# Patient Record
Sex: Female | Born: 1995
Health system: Southern US, Community
[De-identification: ages and names within clinical notes are randomized; demographics above are authoritative.]

## PROBLEM LIST (undated history)

## (undated) DIAGNOSIS — T7422XA Child sexual abuse, confirmed, initial encounter: Secondary | ICD-10-CM

## (undated) DIAGNOSIS — Z21 Asymptomatic human immunodeficiency virus [HIV] infection status: Secondary | ICD-10-CM

## (undated) DIAGNOSIS — F99 Mental disorder, not otherwise specified: Secondary | ICD-10-CM

## (undated) DIAGNOSIS — F259 Schizoaffective disorder, unspecified: Secondary | ICD-10-CM

## (undated) DIAGNOSIS — O98912 Unspecified maternal infectious and parasitic disease complicating pregnancy, second trimester: Secondary | ICD-10-CM

## (undated) DIAGNOSIS — B20 Human immunodeficiency virus [HIV] disease: Secondary | ICD-10-CM

## (undated) HISTORY — DX: Schizoaffective disorder, unspecified: F25.9

## (undated) HISTORY — DX: Unspecified maternal infectious and parasitic disease complicating pregnancy, second trimester: O98.912

## (undated) HISTORY — DX: Child sexual abuse, confirmed, initial encounter: T74.22XA

## (undated) HISTORY — DX: Asymptomatic human immunodeficiency virus (hiv) infection status: Z21

## (undated) HISTORY — DX: Mental disorder, not otherwise specified: F99

## (undated) HISTORY — PX: WISDOM TOOTH EXTRACTION: SHX21

## (undated) HISTORY — DX: Human immunodeficiency virus (HIV) disease: B20

---
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
## 2016-03-24 ENCOUNTER — Ambulatory Visit (INDEPENDENT_AMBULATORY_CARE_PROVIDER_SITE_OTHER): Payer: Medicaid Other | Admitting: Clinical

## 2016-03-24 ENCOUNTER — Ambulatory Visit (INDEPENDENT_AMBULATORY_CARE_PROVIDER_SITE_OTHER): Payer: Medicaid Other

## 2016-03-24 DIAGNOSIS — F3160 Bipolar disorder, current episode mixed, unspecified: Secondary | ICD-10-CM | POA: Diagnosis not present

## 2016-03-24 DIAGNOSIS — Z3202 Encounter for pregnancy test, result negative: Secondary | ICD-10-CM

## 2016-03-24 LAB — POCT PREGNANCY, URINE: PREG TEST UR: NEGATIVE

## 2016-03-24 NOTE — BH Specialist Note (Signed)
Session Start time: 3:50   End Time: 4:20 Total Time:  30 minutes Type of Service: Behavioral Health - Individual/Family Interpreter: No.   Interpreter Name & Language: n/a # [facility]   SUBJECTIVE: [name] is a 20 y.o. female  Pt. was referred by Dr Shawnie PonsPratt for:  anxiety and depression. Pt. reports the following symptoms/concerns: Pt states that she [facility], did not like BH meds(Abilify), [facility], and needs to [facility] back [facility] with negative pregnancy test prior to changing BH meds. Pt says no SI today, though thoughts have been present for years; has coped by "separating myself from the thoughts", journaling, and spending time in quiet.  Duration of problem:  Over two years.  Severity: severe Previous treatment: [facility]   OBJECTIVE: Mood: Appropriate & Affect: Appropriate Risk of harm to self or others: Low risk of harm today as thoughts come and go for years, but no SI today; no known risk of harm to others Assessments administered: PHQ9: 11/ GAD7: 20  LIFE CONTEXT:  Family & Social: No family in [place], family in [place]; supportive friends in [place] School/ Work: Unknown  Self-Care: Sleep issues, sometimes unable to go to sleep; other times, days and nights "reversed" Life changes: None recent What is important to pt/family (values): Overall wellbeing   GOALS ADDRESSED:  -Alleviate symptoms of anxiety and depression, related to bipolar disorder -Increase self-coping skills   INTERVENTIONS: Strength-based and Meditation: CALM relaxation breathing exercise   ASSESSMENT:  Pt currently experiencing Bipolar affective disorder, current episode mixed, current episode severity unspecified. Pt may benefit from psychoeducation and brief therapeutic interventions regarding coping with symptoms of anxiety and depression related to bipolar affective disorder.    PLAN: 1. F/U with behavioral health  clinician: As needed 2. Behavioral Health meds: none  3. Behavioral recommendations:  -[facility] to [facility] psychiat[facility] -Begin therapy sessions at [facility], as recommended by psychiatry -Continue using journaling and other known coping methods to cope daily -Consider relaxation breathing exercise, as practiced in office visit today, as additional self-coping strategy -Read educational material regarding coping with symptoms of anxiety and depression 4. Referral: Brief Counseling/Psychotherapy and Psychoeducation 5. From scale of 1-10, how likely are you to follow plan: Undetermined  Gaynell FaceJamie C Lennox Dolberry LCSWA Behavioral Health Clinician  Marlon PelWarmhandoff:   Warm Hand Off Completed.        Depression screen PHQ 2/9 03/24/2016  Decreased Interest 0  Down, Depressed, Hopeless 3  PHQ - 2 Score 3  Altered sleeping 3  Tired, decreased energy 2  Change in appetite 1  Feeling bad or failure about yourself  0  Trouble concentrating 0  Moving slowly or fidgety/restless 0  Suicidal thoughts 2  PHQ-9 Score 11   GAD 7 : Generalized Anxiety Score 03/24/2016  Nervous, Anxious, on Edge 3  Control/stop worrying 3  Worry too much - different things 3  Trouble relaxing 3  Restless 3  Easily annoyed or irritable 3  Afraid - awful might happen 2  Total GAD 7 Score 20

## 2016-03-24 NOTE — Patient Instructions (Signed)
Patient presented to the office today for a pregnancy test. Test confirms she is not pregnant at this time. Patient is unsure of her last menstrual cycle. I have advised patient to take a home pregnancy test in two weeks to see if results have change. If not she should schedule an appointment to see one of the provider here. Patient verbalizes understanding at this time.

## 2016-04-08 ENCOUNTER — Telehealth: Payer: Self-pay | Admitting: General Practice

## 2016-04-10 ENCOUNTER — Ambulatory Visit: Payer: Self-pay

## 2016-04-28 ENCOUNTER — Ambulatory Visit (HOSPITAL_COMMUNITY)
Admission: EM | Admit: 2016-04-28 | Discharge: 2016-04-28 | Disposition: A | Discharge status: Home / Self Care | Payer: Medicaid Other | Attending: Family Medicine | Admitting: Family Medicine

## 2016-04-28 ENCOUNTER — Encounter (HOSPITAL_COMMUNITY): Payer: Self-pay | Admitting: Emergency Medicine

## 2016-04-28 DIAGNOSIS — R109 Unspecified abdominal pain: Secondary | ICD-10-CM | POA: Diagnosis not present

## 2016-04-28 LAB — POCT URINALYSIS DIP (DEVICE)
Bilirubin Urine: NEGATIVE
Glucose, UA: NEGATIVE mg/dL
KETONES UR: NEGATIVE mg/dL
LEUKOCYTES UA: NEGATIVE
NITRITE: NEGATIVE
PH: 7 (ref 5.0–8.0)
Protein, ur: 30 mg/dL — AB
SPECIFIC GRAVITY, URINE: 1.025 (ref 1.005–1.030)
UROBILINOGEN UA: 2 mg/dL — AB (ref 0.0–1.0)

## 2016-04-28 LAB — POCT PREGNANCY, URINE: Preg Test, Ur: NEGATIVE

## 2016-04-28 MED ORDER — DICLOFENAC POTASSIUM 50 MG PO TABS: 50.0000 mg | ORAL_TABLET | Freq: Three times a day (TID) | ORAL | 0 refills | Status: DC

## 2016-04-28 MED ORDER — CYCLOBENZAPRINE HCL 5 MG PO TABS: 5.0000 mg | ORAL_TABLET | Freq: Three times a day (TID) | ORAL | 0 refills | Status: DC

## 2016-04-28 NOTE — ED Triage Notes (Signed)
Pain in right lower back, flank, and into right thigh.  Pain for "2 months"  Denies urinary symptoms

## 2016-04-28 NOTE — ED Provider Notes (Signed)
[facility]    CSN: [id number] Arrival date & time: 04/28/16  1339     History   Chief Complaint Chief Complaint  Patient presents with  . Flank Pain    HPI Anna LandauKatrina Mason is a 21 y.o. female.   The history is provided by the patient.  Flank Pain  This is a chronic problem. Episode onset: 2mos of sx. The problem has been gradually worsening. Associated symptoms include abdominal pain. Pertinent negatives include no chest pain, no headaches and no shortness of breath. The symptoms are aggravated by twisting.    History reviewed. No pertinent past medical history.  Patient Active Problem List   Diagnosis Date Noted  . MDD (major depressive disorder), recurrent episode, severe (HCC) 10/25/2013  . Suicidal ideation 10/25/2013  . Suicide attempt 10/25/2013  . Anxiety state, unspecified 10/25/2013  . Anticholinergic drug overdose 10/23/2013    Past Surgical History:  Procedure Laterality Date  . WISDOM TOOTH EXTRACTION      OB History    No data available       Home Medications    Prior to Admission medications   Medication Sig Start Date End Date Taking? Authorizing Provider  citalopram (CELEXA) 20 MG tablet Take 1 tablet (20 mg total) by mouth daily. Patient not taking: Reported on 03/24/2016 11/01/13   Kendrick FriesMeghan Blankmann, NP  cyclobenzaprine (FLEXERIL) 5 MG tablet Take 1 tablet (5 mg total) by mouth 3 (three) times daily. 04/28/16   Linna HoffJames D Pam Vanalstine, MD  diclofenac (CATAFLAM) 50 MG tablet Take 1 tablet (50 mg total) by mouth 3 (three) times daily. 04/28/16   Linna HoffJames D Rashida Ladouceur, MD  traZODone (DESYREL) 50 MG tablet Take 1 tablet (50 mg total) by mouth at bedtime. Patient not taking: Reported on 03/24/2016 11/01/13   Kendrick FriesMeghan Blankmann, NP    Family History No family history on file.  Social History Social History  Substance Use Topics  . Smoking status: Never Smoker  . Smokeless tobacco: Never Used  . Alcohol use No     Allergies   Patient has no known  allergies.   Review of Systems Review of Systems  Constitutional: Negative.   Respiratory: Negative for shortness of breath.   Cardiovascular: Negative for chest pain.  Gastrointestinal: Positive for abdominal pain. Negative for diarrhea, nausea and vomiting.  Genitourinary: Positive for flank pain. Negative for decreased urine volume, hematuria, pelvic pain and urgency.  Neurological: Negative for headaches.  All other systems reviewed and are negative.    Physical Exam Triage Vital Signs ED Triage Vitals  Enc Vitals Group     BP 04/28/16 1437 142/89     Pulse Rate 04/28/16 1437 73     Resp 04/28/16 1437 16     Temp 04/28/16 1437 99.2 F (37.3 C)     Temp Source 04/28/16 1437 Oral     SpO2 04/28/16 1437 100 %     Weight --      Height --      Head Circumference --      Peak Flow --      Pain Score 04/28/16 1436 10     Pain Loc --      Pain Edu? --      Excl. in GC? --    No data found.   Updated Vital Signs BP 142/89 (BP Location: Left Arm)   Pulse 73   Temp 99.2 F (37.3 C) (Oral)   Resp 16   LMP 04/28/2016   SpO2 100%  Visual Acuity Right Eye Distance:   Left Eye Distance:   Bilateral Distance:    Right Eye Near:   Left Eye Near:    Bilateral Near:     Physical Exam  Constitutional: She is oriented to person, place, and time. She appears well-developed and well-nourished. No distress.  Cardiovascular: Normal rate, regular rhythm, normal heart sounds and intact distal pulses.   Pulmonary/Chest: Effort normal and breath sounds normal.  Abdominal: Soft. Bowel sounds are normal. She exhibits no distension and no mass. There is tenderness. There is no rebound and no guarding. No hernia.  Musculoskeletal: She exhibits tenderness.  Neurological: She is alert and oriented to person, place, and time. No cranial nerve deficit.  Skin: Skin is warm and dry.  Nursing note and vitals reviewed.    UC Treatments / Results  Labs (all labs ordered are listed,  but only abnormal results are displayed) Labs Reviewed  POCT URINALYSIS DIP (DEVICE) - Abnormal; Notable for the following:       Result Value   Hgb urine dipstick TRACE (*)    Protein, ur 30 (*)    Urobilinogen, UA 2.0 (*)    All other components within normal limits  POCT PREGNANCY, URINE    EKG  EKG Interpretation None       Radiology No results found.  Procedures Procedures (including critical care time)  Medications Ordered in UC Medications - No data to display   Initial Impression / Assessment and Plan / UC Course  I have reviewed the triage vital signs and the nursing notes.  Pertinent labs & imaging results that were available during my care of the patient were reviewed by me and considered in my medical decision making (see chart for details).       Final Clinical Impressions(s) / UC Diagnoses   Final diagnoses:  Right flank pain    New Prescriptions Discharge Medication List as of 04/28/2016  4:12 PM    START taking these medications   Details  cyclobenzaprine (FLEXERIL) 5 MG tablet Take 1 tablet (5 mg total) by mouth 3 (three) times daily., Starting Mon 04/28/2016, Normal    diclofenac (CATAFLAM) 50 MG tablet Take 1 tablet (50 mg total) by mouth 3 (three) times daily., Starting Mon 04/28/2016, Normal         Linna Hoff, MD 05/15/16 [phone]

## 2016-04-28 NOTE — Discharge Instructions (Signed)
Heat to back and medicine as needed, return to ER if further problems.

## 2016-04-28 NOTE — ED Provider Notes (Signed)
CSN: [id number]     Arrival date & time 04/28/16  1339 History   First MD Initiated Contact with Patient 04/28/16 1553     Chief Complaint  Patient presents with  . Flank Pain   (Consider location/radiation/quality/duration/timing/severity/associated sxs/prior Treatment) C/o back pain   The history is provided by the patient.  Flank Pain  This is a new problem. The current episode started more than 1 week ago. The problem occurs constantly. The problem has not changed since onset.   History reviewed. No pertinent past medical history. Past Surgical History:  Procedure Laterality Date  . WISDOM TOOTH EXTRACTION     No family history on file. Social History  Substance Use Topics  . Smoking status: Never Smoker  . Smokeless tobacco: Never Used  . Alcohol use No   OB History    No data available     Review of Systems  Constitutional: Negative.   HENT: Negative.   Eyes: Negative.   Respiratory: Negative.   Cardiovascular: Negative.   Gastrointestinal: Negative.   Endocrine: Negative.   Genitourinary: Positive for flank pain.  Allergic/Immunologic: Negative.   Neurological: Negative.   Hematological: Negative.     Allergies  Patient has no known allergies.  Home Medications   Prior to Admission medications   Medication Sig Start Date End Date Taking? Authorizing Provider  citalopram (CELEXA) 20 MG tablet Take 1 tablet (20 mg total) by mouth daily. Patient not taking: Reported on 03/24/2016 11/01/13   Kendrick Fries, NP  cyclobenzaprine (FLEXERIL) 5 MG tablet Take 1 tablet (5 mg total) by mouth 3 (three) times daily. 04/28/16   Linna Hoff, MD  diclofenac (CATAFLAM) 50 MG tablet Take 1 tablet (50 mg total) by mouth 3 (three) times daily. 04/28/16   Linna Hoff, MD  traZODone (DESYREL) 50 MG tablet Take 1 tablet (50 mg total) by mouth at bedtime. Patient not taking: Reported on 03/24/2016 11/01/13   Kendrick Fries, NP   Meds Ordered and Administered this Visit   Medications - No data to display  BP 142/89 (BP Location: Left Arm)   Pulse 73   Temp 99.2 F (37.3 C) (Oral)   Resp 16   LMP 04/28/2016   SpO2 100%  No data found.   Physical Exam  Constitutional: She appears well-developed and well-nourished.  HENT:  Head: Normocephalic and atraumatic.  Right Ear: External ear normal.  Left Ear: External ear normal.  Mouth/Throat: Oropharynx is clear and moist.  Eyes: Conjunctivae and EOM are normal. Pupils are equal, round, and reactive to light.  Neck: Normal range of motion. Neck supple.  Cardiovascular: Normal rate, regular rhythm and normal heart sounds.   Pulmonary/Chest: Breath sounds normal.  Musculoskeletal: She exhibits tenderness.  TTP LS spine.  Nursing note and vitals reviewed.   Urgent Care Course   Clinical Course     Procedures (including critical care time)  Labs Review Labs Reviewed  POCT URINALYSIS DIP (DEVICE) - Abnormal; Notable for the following:       Result Value   Hgb urine dipstick TRACE (*)    Protein, ur 30 (*)    Urobilinogen, UA 2.0 (*)    All other components within normal limits  POCT PREGNANCY, URINE    Imaging Review No results found.   Visual Acuity Review  Right Eye Distance:   Left Eye Distance:   Bilateral Distance:    Right Eye Near:   Left Eye Near:    Bilateral Near:  MDM   1. Right flank pain    Diclofenac Cyclobenzaprine        Deatra CanterWilliam J Berdell Nevitt, FNP 04/28/16 2121

## 2017-03-18 ENCOUNTER — Encounter (HOSPITAL_COMMUNITY): Payer: Self-pay | Admitting: Emergency Medicine

## 2017-03-18 ENCOUNTER — Emergency Department (HOSPITAL_COMMUNITY)
Admission: EM | Admit: 2017-03-18 | Discharge: 2017-03-18 | Disposition: A | Discharge status: Home / Self Care | Payer: Medicaid Other | Attending: Emergency Medicine | Admitting: Emergency Medicine

## 2017-03-18 ENCOUNTER — Emergency Department (HOSPITAL_COMMUNITY): Payer: Medicaid Other

## 2017-03-18 DIAGNOSIS — Z202 Contact with and (suspected) exposure to infections with a predominantly sexual mode of transmission: Secondary | ICD-10-CM | POA: Diagnosis not present

## 2017-03-18 DIAGNOSIS — R05 Cough: Secondary | ICD-10-CM | POA: Diagnosis present

## 2017-03-18 DIAGNOSIS — J189 Pneumonia, unspecified organism: Secondary | ICD-10-CM

## 2017-03-18 DIAGNOSIS — J181 Lobar pneumonia, unspecified organism: Secondary | ICD-10-CM | POA: Diagnosis not present

## 2017-03-18 DIAGNOSIS — H1033 Unspecified acute conjunctivitis, bilateral: Secondary | ICD-10-CM | POA: Diagnosis not present

## 2017-03-18 DIAGNOSIS — Z79899 Other long term (current) drug therapy: Secondary | ICD-10-CM | POA: Diagnosis not present

## 2017-03-18 MED ORDER — ERYTHROMYCIN 5 MG/GM OP OINT
TOPICAL_OINTMENT | Freq: Once | OPHTHALMIC | Status: AC
  Administered 2017-03-18: 1 via OPHTHALMIC
  Filled 2017-03-18: qty 3.5

## 2017-03-18 MED ORDER — CEFTRIAXONE SODIUM 1 G IJ SOLR
1.0000 g | Freq: Once | INTRAMUSCULAR | Status: AC
  Administered 2017-03-18: 1 g via INTRAMUSCULAR
  Filled 2017-03-18: qty 10

## 2017-03-18 MED ORDER — PROMETHAZINE-DM 6.25-15 MG/5ML PO SYRP: 5.0000 mL | ORAL_SOLUTION | Freq: Four times a day (QID) | ORAL | 0 refills | Status: DC | PRN

## 2017-03-18 MED ORDER — DOXYCYCLINE HYCLATE 100 MG PO CAPS: 100.0000 mg | ORAL_CAPSULE | Freq: Two times a day (BID) | ORAL | 0 refills | Status: DC

## 2017-03-18 MED ORDER — METRONIDAZOLE 500 MG PO TABS
2000.0000 mg | ORAL_TABLET | Freq: Once | ORAL | Status: AC
  Administered 2017-03-18: 2000 mg via ORAL
  Filled 2017-03-18: qty 4

## 2017-03-18 NOTE — Discharge Instructions (Signed)
Your chest x-ray shows that you have pneumonia in the left lower lung.  The treatment for this is an antibiotic called doxycycline.  Take this antibiotic 2 times daily for the next 7 days.  Do not stop taking this medication before you finish off 14 tablets even if you start to feel better.  You can also take 5 mL of promethazine next methylmorphine cough syrup once every 6 hours as needed for cough and congestion.  650 mg of Tylenol or 600 mg of ibuprofen with food can be taken every 6-8 hours as needed for pain control.  You have also been treated for trichomonas, gonorrhea, and chlamydia, which are sexually transmitted infections, in the emergency department.  It is important that you do not have sex or engage in any kind of sexual activity for 7 days after being treated because you increase the risk of getting reinfected.  It is important that all sexual partners now that you are currently being treated.  For the drainage from her eyes, please place a 0.5 inch (approximately the width of the tip of your pinky) of erythromycin ointment in both eyes every 6 hours for the next 5 days.  It is important to wash your hands anytime you touch her eyes or touch her face because it is possible to pass this infection to other people.  If your symptoms do not improve in the next 3-5 days, please follow-up with your primary care provider.  If you develop new or worsening symptoms, including difficulty breathing, fever that does not improve with Tylenol, changes to vision, or other concerning symptoms, please return to the emergency department for reevaluation.

## 2017-03-18 NOTE — ED Provider Notes (Signed)
[facility]-EMERGENCY DEPT Provider Note   CSN: [id number] Arrival date & time: 03/18/17  1035     History   Chief Complaint Chief Complaint  Patient presents with  . Cough    HPI Anna Mason is a 21 y.o. female who presents to the emergency department with a chief complaint of nasal congestion, rhinorrhea, nonproductive cough times 2 weeks.  She reports her symptoms have been constant, but have not worsened.  No aggravating or alleviating factors.  She also complains of bilateral eye redness and purulent discharge from her bilateral eyes since yesterday and reports that her eyes were matted shut when she woke this morning. she denies fever, chills, sinus pain or pressure, dyspnea, nausea, vomiting, diarrhea, myalgias, or otalgia.  She is treated her symptoms with Mucinex and DayQuil since onset with no improvement.  She reports that her female sexual partner has recently been complaining of discharge from his penis, and she is concerned she may have an STD. She denies vaginal itching, discharge, pain, or bleeding. No dyspareunia.  She denies frequency, dysuria, or urinary hesitancy.  No back or pelvic pain.  She reports that she is currently only sexually active with one female partner. LMP 03/11/17.   She does not wear contact lens.  The history is provided by the patient. No language interpreter was used.    History reviewed. No pertinent past medical history.  Patient Active Problem List   Diagnosis Date Noted  . MDD (major depressive disorder), recurrent episode, severe (HCC) 10/25/2013  . Suicidal ideation 10/25/2013  . Suicide attempt (HCC) 10/25/2013  . Anxiety state, unspecified 10/25/2013  . Anticholinergic drug overdose 10/23/2013    Past Surgical History:  Procedure Laterality Date  . WISDOM TOOTH EXTRACTION      OB History    No data available       Home Medications    Prior to Admission medications   Medication Sig Start Date End  Date Taking? Authorizing Provider  GuaiFENesin (COUGH SYRUP PO) Take 15 mLs by mouth daily as needed (cough).    Yes [provider]  guaiFENesin (MUCINEX) 600 MG 12 hr tablet Take 600 mg by mouth 2 (two) times daily as needed for cough (FLU).   Yes [provider]  doxycycline (VIBRAMYCIN) 100 MG capsule Take 1 capsule (100 mg total) by mouth 2 (two) times daily. 03/18/17   Nadyne Gariepy A, PA-C  promethazine-dextromethorphan (PROMETHAZINE-DM) 6.25-15 MG/5ML syrup Take 5 mLs by mouth 4 (four) times daily as needed for cough. 03/18/17   Clevester Helzer A, PA-C    Family History No family history on file.  Social History Social History   Tobacco Use  . Smoking status: Never Smoker  . Smokeless tobacco: Never Used  Substance Use Topics  . Alcohol use: No  . Drug use: Yes    Types: Marijuana     Allergies   Pineapple   Review of Systems Review of Systems  Constitutional: Negative for chills and fever.  HENT: Positive for congestion, postnasal drip, rhinorrhea and sore throat. Negative for ear discharge, ear pain, facial swelling, mouth sores and trouble swallowing.   Eyes: Positive for discharge and redness.  Respiratory: Positive for cough. Negative for shortness of breath.   Cardiovascular: Negative for chest pain.  Gastrointestinal: Negative for diarrhea, nausea and vomiting.  Genitourinary: Negative for decreased urine volume, dysuria, menstrual problem, vaginal bleeding, vaginal discharge and vaginal pain.  Musculoskeletal: Negative for back pain.  Skin: Negative for rash.  Allergic/Immunologic:  Negative for immunocompromised state.  Neurological: Positive for headaches.  Psychiatric/Behavioral: Negative for confusion.     Physical Exam Updated Vital Signs BP 132/82 (BP Location: Left Arm)   Pulse (!) 113   Temp 98.4 F (36.9 C) (Oral)   Resp 18   Ht 5\' 7"  (1.702 m)   Wt 77.1 kg (170 lb)   LMP 03/11/2017   SpO2 99%   BMI 26.63 kg/m   Physical  Exam  Constitutional: No distress.  HENT:  Head: Normocephalic.  Right Ear: Hearing and ear canal normal. A middle ear effusion is present.  Left Ear: Hearing and ear canal normal. A middle ear effusion is present.  Nose: Mucosal edema and rhinorrhea present. Right sinus exhibits no maxillary sinus tenderness. Left sinus exhibits no maxillary sinus tenderness and no frontal sinus tenderness.  Mouth/Throat: Uvula is midline, oropharynx is clear and moist and mucous membranes are normal. No tonsillar exudate.  Eyes: EOM are normal. Pupils are equal, round, and reactive to light. Right eye exhibits discharge. Left eye exhibits discharge. Right conjunctiva is injected. Left conjunctiva is injected.  Purulent discharge noted from the bilateral eyes.  Neck: Neck supple.  Cardiovascular: Normal rate, regular rhythm, normal heart sounds and intact distal pulses. Exam reveals no gallop and no friction rub.  No murmur heard. Pulmonary/Chest: Effort normal. No respiratory distress.  Crackles heard in the left lower lobe.  Right lung is clear to auscultation.  Abdominal: Soft. She exhibits no distension.  Neurological: She is alert.  Skin: Skin is warm. No rash noted.  Psychiatric: Her behavior is normal.  Nursing note and vitals reviewed.    ED Treatments / Results  Labs (all labs ordered are listed, but only abnormal results are displayed) Labs Reviewed - No data to display  EKG  EKG Interpretation None       Radiology Dg Chest 2 View  Result Date: 03/18/2017 CLINICAL DATA:  Cough and congestion.  Runny nose. EXAM: CHEST  2 VIEW COMPARISON:  None. FINDINGS: Airspace disease at the medial left base. No effusion or visible cavitation. Normal heart size and mediastinal contours. IMPRESSION: Left lower lobe pneumonia. Electronically Signed   By: Marnee SpringJonathon  Watts M.D.   On: 03/18/2017 14:12    Procedures Procedures (including critical care time)  Medications Ordered in ED Medications    cefTRIAXone (ROCEPHIN) injection 1 g (not administered)  metroNIDAZOLE (FLAGYL) tablet 2,000 mg (not administered)  erythromycin ophthalmic ointment (not administered)     Initial Impression / Assessment and Plan / ED Course  I have reviewed the triage vital signs and the nursing notes.  Pertinent labs & imaging results that were available during my care of the patient were reviewed by me and considered in my medical decision making (see chart for details).     21 year old female presenting with multiple complaints including cough, nasal congestion, rhinorrhea, and postnasal drip x2 weeks.  She also reports bilateral eye redness with purulent drainage and eyes matted shut bilaterally this AM.  She also reports that her female sexual partner was recently treated for STD.  Discussed and evaluated this patient with Dr. Eudelia Bunchardama, attending physician.  Will treat the patient for trichomonas, gonorrhea, and chlamydia.  Chest x-ray demonstrating left lower lobe pneumonia.  We will discharge the patient with a 7-day course of doxycycline to treat CAP and chlamydia.  Will treat the patient with IM ceftriaxone for gonorrhea.  The patient also has symptoms consistent with bilateral bacterial conjunctivitis.  She does not wear contact lens  so we will discharge the patient with erythromycin ointment and follow-up to ophthalmology if her symptoms do not improve.  She is hemodynamically stable and well-appearing.  SaO2 99% on RA. Afebrile. At this time, I feel that she is safe for discharge home.  Strict return precautions given.  No acute distress.  Final Clinical Impressions(s) / ED Diagnoses   Final diagnoses:  Community acquired pneumonia of left lower lobe of lung (HCC)  STD exposure  Acute bacterial conjunctivitis of both eyes    ED Discharge Orders        Ordered    doxycycline (VIBRAMYCIN) 100 MG capsule  2 times daily     03/18/17 1523    promethazine-dextromethorphan (PROMETHAZINE-DM) 6.25-15  MG/5ML syrup  4 times daily PRN     03/18/17 1523       Ronika Kelson, Coral Else, PA-C 03/18/17 1535    Cardama, Amadeo Garnet, MD 03/18/17 1654

## 2017-03-18 NOTE — ED Notes (Signed)
Pt reports that she has has a dry non productive, cough x 2 weeks. Pt also reports that she wanted to get an HIV test.

## 2017-03-18 NOTE — ED Triage Notes (Signed)
Patient c/o cough and congestion x2 weeks. Patient also reports she needs STD check, as partner was just checked and told her to come in. Denies any STD symptoms.

## 2018-04-14 NOTE — L&D Delivery Note (Addendum)
Delivery Note:  G1P0 at [redacted]w[redacted]d  Admitting diagnosis: [place] Broke  Risks: HIV positive, treated for Chlamydia on 01/17/2019 with no TOC  Augmentation: Pitocin ROM: SROM 02/14/2019 @ 2050  Complete dilation at 02/15/2019  @ 1135 Onset of pushing at 1216 FHR second stage Cat 2 - occasional mild variable decels with ctx while pushing  Analgesia /Anesthesia intrapartum:Epidural  Pushing in lithotomy position DO, CNM, RN, FOB present for birth and supportive  Delivery of a Live born female  Birth Weight:  pending APGAR: 9, 9  Newborn Delivery   Birth date/time: 02/15/2019 13:09:00 Delivery type: Vaginal, Spontaneous      Baby delivered in vertex presentation, LOA restituting to LOT.  Nuchal Cord: No  Cord double clamped after cessation of pulsation, cut by FOB under my direct supervision.  Collection of cord blood for typing completed. Cord blood donation-None  Arterial cord blood sample-No    Placenta delivered-Spontaneous  with 3 vessels . Uterotonics: IV Pitocin following delivery Placenta to L&D to be discarded. Uterine tone firm, bleeding light.  Labial  laceration identified.  Episiotomy:None  Local analgesia: Epidural in place  Repair: Bilateral labial lacerations - Repaired with 4-0 Vicryl in the usual fashion. Est. Blood Loss (mL): 283 mL Complications: None  APGAR:1 min-9 , 5 min-9    Mom to postpartum.  Baby to Couplet care / Skin to Skin.  Delivery Report:  Review the Delivery Report for details.     Signed: Juanna Cao, SNM, BSN 02/15/2019, 1:31 PM   GME ATTESTATION:  I saw and evaluated the patient. I was gowned and gloved throughout the entire delivery. I personally repaired a labial tear and direct the midwife student in the second labial repair. I agree with the findings and the plan of care as documented in the midwifery student's note.  Merilyn Baba, DO OB Fellow, Faculty Practice 02/15/2019 1:50 PM

## 2018-06-17 ENCOUNTER — Ambulatory Visit (INDEPENDENT_AMBULATORY_CARE_PROVIDER_SITE_OTHER): Payer: Medicare Other

## 2018-06-17 DIAGNOSIS — Z3200 Encounter for pregnancy test, result unknown: Secondary | ICD-10-CM

## 2018-06-17 DIAGNOSIS — Z3201 Encounter for pregnancy test, result positive: Secondary | ICD-10-CM | POA: Diagnosis not present

## 2018-06-17 LAB — POCT URINE PREGNANCY: Preg Test, Ur: POSITIVE — AB

## 2018-06-17 NOTE — Progress Notes (Signed)
I have reviewed the chart and agree with nursing staff's documentation of this patient's encounter.  Jaynie Collins, MD 06/17/2018 4:54 PM

## 2018-06-17 NOTE — Progress Notes (Signed)
..   Anna Mason presents today for UPT. She has no unusual complaints. LMP:05-12-18    OBJECTIVE: Appears well, in no apparent distress.  OB History    Gravida  1   Para      Term      Preterm      AB      Living        SAB      TAB      Ectopic      Multiple      Live Births             Home UPT Result:Positive    In-Office UPT result:Positive I have reviewed the patient's medical, obstetrical, social, and family histories, and medications.   ASSESSMENT: Positive pregnancy test  PLAN Prenatal care to be completed at: [facility]

## 2018-07-28 ENCOUNTER — Ambulatory Visit (INDEPENDENT_AMBULATORY_CARE_PROVIDER_SITE_OTHER): Payer: Medicare Other | Admitting: Advanced Practice Midwife

## 2018-07-28 ENCOUNTER — Encounter: Payer: Self-pay | Admitting: Advanced Practice Midwife

## 2018-07-28 ENCOUNTER — Other Ambulatory Visit: Payer: Self-pay

## 2018-07-28 ENCOUNTER — Other Ambulatory Visit (HOSPITAL_COMMUNITY)
Admission: RE | Admit: 2018-07-28 | Discharge: 2018-07-28 | Disposition: A | Discharge status: Home / Self Care | Payer: Medicare Other | Source: Ambulatory Visit | Attending: Advanced Practice Midwife | Admitting: Advanced Practice Midwife

## 2018-07-28 VITALS — BP 116/71 | HR 88 | Wt 162.7 lb

## 2018-07-28 DIAGNOSIS — O0990 Supervision of high risk pregnancy, unspecified, unspecified trimester: Secondary | ICD-10-CM | POA: Insufficient documentation

## 2018-07-28 DIAGNOSIS — T1491XA Suicide attempt, initial encounter: Secondary | ICD-10-CM

## 2018-07-28 DIAGNOSIS — Z3480 Encounter for supervision of other normal pregnancy, unspecified trimester: Secondary | ICD-10-CM

## 2018-07-28 DIAGNOSIS — Z3A11 11 weeks gestation of pregnancy: Secondary | ICD-10-CM | POA: Diagnosis not present

## 2018-07-28 DIAGNOSIS — Z3401 Encounter for supervision of normal first pregnancy, first trimester: Secondary | ICD-10-CM | POA: Insufficient documentation

## 2018-07-28 DIAGNOSIS — F332 Major depressive disorder, recurrent severe without psychotic features: Secondary | ICD-10-CM

## 2018-07-28 DIAGNOSIS — Z3481 Encounter for supervision of other normal pregnancy, first trimester: Secondary | ICD-10-CM | POA: Diagnosis not present

## 2018-07-28 DIAGNOSIS — O99341 Other mental disorders complicating pregnancy, first trimester: Secondary | ICD-10-CM

## 2018-07-28 DIAGNOSIS — F419 Anxiety disorder, unspecified: Secondary | ICD-10-CM

## 2018-07-28 MED ORDER — DOXYLAMINE SUCCINATE (SLEEP) 25 MG PO TABS: 25.0000 mg | ORAL_TABLET | Freq: Four times a day (QID) | ORAL | 2 refills | Status: DC | PRN

## 2018-07-28 MED ORDER — PREPLUS 27-1 MG PO TABS: 1.0000 | ORAL_TABLET | Freq: Every day | ORAL | 13 refills | Status: DC

## 2018-07-28 MED ORDER — ASPIRIN EC 81 MG PO TBEC: 81.0000 mg | DELAYED_RELEASE_TABLET | Freq: Every day | ORAL | 2 refills | Status: DC

## 2018-07-28 MED ORDER — PYRIDOXINE HCL 25 MG PO TABS: 25.0000 mg | ORAL_TABLET | Freq: Three times a day (TID) | ORAL | 0 refills | Status: DC

## 2018-07-28 NOTE — Progress Notes (Addendum)
History:   Anna Mason is a 23 y.o. G1P0 at 5775w0d by LMP being seen today for her first obstetrical visit. She does not have any concerning GYN or obstetrical history. Patient does not intend to breast feed. She is planning to use Nexplanon for postpartum contraception. Pregnancy history fully reviewed.  Patient reports nausea and vomiting. She reports nausea with some vomiting throughout the day but she is managing effectively with bland diet. She is not taking any medication at this time.   Patient lives with her boyfriend/FOB "Allen". She reports feeling safe and supported. She denies thoughts of self-harm, concern for IPV.   HISTORY: OB History  Gravida Para Term Preterm AB Living  1 0 0 0 0 0  SAB TAB Ectopic Multiple Live Births  0 0 0 0 0    # Outcome Date GA Lbr Len/2nd Weight Sex Delivery Anes PTL Lv  1 Current             No pap history, per patient  Past Medical History:  Diagnosis Date   Mental disorder    Past Surgical History:  Procedure Laterality Date   WISDOM TOOTH EXTRACTION     Family History  Problem Relation Age of Onset   Diabetes Mother    Diabetes Maternal Grandmother    Social History   Tobacco Use   Smoking status: Never Smoker   Smokeless tobacco: Never Used  Substance Use Topics   Alcohol use: No   Drug use: Yes    Types: Marijuana   Allergies  Allergen Reactions   Pineapple Swelling   No current outpatient medications on file prior to visit.   No current facility-administered medications on file prior to visit.     Review of Systems Pertinent items noted in HPI and remainder of comprehensive ROS otherwise negative. Physical Exam:   Vitals:   07/28/18 1332  BP: 116/71  Pulse: 88  Weight: 73.8 kg   Fetal Heart Rate (bpm): 166 Uterus:     Pelvic Exam: Perineum: no hemorrhoids, normal perineum   Vulva: normal external genitalia, no lesions   Vagina:  normal mucosa, normal discharge   Cervix: no lesions and  normal, pap smear done.    Adnexa: normal adnexa and no mass, fullness, tenderness   Bony Pelvis: average  System: General: well-developed, well-nourished female in no acute distress   Breasts:  normal appearance, no masses or tenderness bilaterally   Skin: normal coloration and turgor, no rashes   Neurologic: oriented, normal, negative, normal mood   Extremities: normal strength, tone, and muscle mass, ROM of all joints is normal   HEENT PERRLA, extraocular movement intact and sclera clear, anicteric   Mouth/Teeth mucous membranes moist, pharynx normal without lesions and dental hygiene good   Neck supple and no masses   Cardiovascular: regular rate and rhythm   Respiratory:  no respiratory distress, normal breath sounds   Abdomen: soft, non-tender; bowel sounds normal; no masses,  no organomegaly     Assessment:    Pregnancy: G1P0 Patient Active Problem List   Diagnosis Date Noted   Supervision of other normal pregnancy, antepartum 07/28/2018   MDD (major depressive disorder), recurrent episode, severe (HCC) 10/25/2013   Suicidal ideation 10/25/2013   Suicide attempt (HCC) 10/25/2013   Anxiety state, unspecified 10/25/2013   Anticholinergic drug overdose 10/23/2013     Indications for ASA therapy (per uptodate) One of the following: Previous pregnancy with preeclampsia, especially early onset and with an adverse outcome No Multifetal gestation  No Chronic hypertension No Type 1 or 2 diabetes mellitus No Chronic kidney disease No Autoimmune disease (antiphospholipid syndrome, systemic lupus erythematosus) No  Two or more of the following: Nulliparity Yes Obesity (body mass index >30 kg/m2) No Family history of preeclampsia in mother or sister No Age ?35 years No Sociodemographic characteristics (African American race, low socioeconomic level) Yes Personal risk factors (eg, previous pregnancy with low birth weight or small for gestational age infant, previous adverse  pregnancy outcome [eg, stillbirth], interval >10 years between pregnancies) No   Plan:    1. Supervision of other normal pregnancy, antepartum - No complaints or concerns - Rx B6 + Unisom, PNV to pharmacy - Start Aspirin 81mg  daily one week from today for Preeclampsia prophylaxis. Rx to pharmacy - Reviewed anticipatory guidance for second trimester and prenatal visits - Culture, OB Urine - Obstetric Panel, Including HIV - Cervicovaginal ancillary only( [place]) - Cytology - PAP( [place]) - Genetic Screening - [facility] AMB BABYSCRIPTS SCHEDULE OPTIMIZATION - [place] MFM OB COMP + 14 WK; Future - Comprehensive metabolic panel  2. Severe episode of recurrent major depressive disorder, without psychotic features (HCC) - Previously discontinued medication. Advised that may not be necessary, recommend consult with BH. Pt agreeable - Hulda Marin, Behavioral Health clinician called for consult - Ambulatory referral to Integrated Behavioral Health - PQH-9 score of 5 today, no thoughts of SI or HI  3. Anxiety   4. Suicide attempt [facility])  Initial labs drawn. Continue prenatal vitamins. Genetic Screening discussed, First trimester screen, Quad screen and NIPS: ordered. Ultrasound discussed; fetal anatomic survey: ordered. Problem list reviewed and updated. The nature of [facility] with multiple MDs and other Advanced Practice Providers was explained to patient; also emphasized that residents, students are part of our team. Discussed [employer] schedule in current setting of COVID-19 precautions and general schedule for virtual and clinic visits Routine obstetric precautions reviewed. Return in about 8 weeks (around 09/22/2018) for televisit .     Clayton Bibles, Certified Nurse Midwife [facility] for [employer], [facility]

## 2018-07-28 NOTE — Patient Instructions (Signed)

## 2018-07-29 ENCOUNTER — Telehealth: Payer: Self-pay | Admitting: Advanced Practice Midwife

## 2018-07-29 ENCOUNTER — Encounter: Payer: Self-pay | Admitting: Advanced Practice Midwife

## 2018-07-29 DIAGNOSIS — A749 Chlamydial infection, unspecified: Secondary | ICD-10-CM | POA: Insufficient documentation

## 2018-07-29 LAB — CERVICOVAGINAL ANCILLARY ONLY
Chlamydia: POSITIVE — AB
Neisseria Gonorrhea: NEGATIVE

## 2018-07-29 MED ORDER — AZITHROMYCIN 500 MG PO TABS: 1000.0000 mg | ORAL_TABLET | Freq: Once | ORAL | 0 refills | Status: DC

## 2018-07-29 NOTE — Telephone Encounter (Signed)
Positive Chlamydia 07/28/2018. Notified via phone call, two patient identifiers. Confirmed correct pharmacy. Discussed need for partner testing and treatment as close to same time as possible. No intercourse until 7 days after treatment, condoms to reduce transmission. Patient states she will pick up tomorrow.   Clayton Bibles, CNM 07/29/18  1:44 PM

## 2018-07-29 NOTE — Telephone Encounter (Signed)
VLTCB

## 2018-07-30 ENCOUNTER — Telehealth: Payer: Self-pay | Admitting: Advanced Practice Midwife

## 2018-07-30 ENCOUNTER — Telehealth: Payer: Self-pay

## 2018-07-30 ENCOUNTER — Telehealth: Payer: Self-pay | Admitting: Infectious Disease

## 2018-07-30 ENCOUNTER — Encounter: Payer: Self-pay | Admitting: Advanced Practice Midwife

## 2018-07-30 DIAGNOSIS — Z21 Asymptomatic human immunodeficiency virus [HIV] infection status: Secondary | ICD-10-CM

## 2018-07-30 LAB — COMPREHENSIVE METABOLIC PANEL
ALT: 7 IU/L (ref 0–32)
AST: 11 IU/L (ref 0–40)
Albumin/Globulin Ratio: 0.8 — ABNORMAL LOW (ref 1.2–2.2)
Albumin: 4 g/dL (ref 3.9–5.0)
Alkaline Phosphatase: 34 IU/L — ABNORMAL LOW (ref 39–117)
BUN/Creatinine Ratio: 18 (ref 9–23)
BUN: 10 mg/dL (ref 6–20)
Bilirubin Total: 0.4 mg/dL (ref 0.0–1.2)
CO2: 18 mmol/L — ABNORMAL LOW (ref 20–29)
Calcium: 9.1 mg/dL (ref 8.7–10.2)
Chloride: 104 mmol/L (ref 96–106)
Creatinine, Ser: 0.55 mg/dL — ABNORMAL LOW (ref 0.57–1.00)
GFR calc Af Amer: 154 mL/min/{1.73_m2} (ref >59)
GFR calc non Af Amer: 134 mL/min/{1.73_m2} (ref >59)
Globulin, Total: 4.9 g/dL — ABNORMAL HIGH (ref 1.5–4.5)
Glucose: 88 mg/dL (ref 65–99)
Potassium: 4 mmol/L (ref 3.5–5.2)
Sodium: 131 mmol/L — ABNORMAL LOW (ref 134–144)
Total Protein: 8.9 g/dL — ABNORMAL HIGH (ref 6.0–8.5)

## 2018-07-30 LAB — OBSTETRIC PANEL, INCLUDING HIV
Antibody Screen: NEGATIVE
Basophils Absolute: 0 x10E3/uL (ref 0.0–0.2)
Basos: 0 %
EOS (ABSOLUTE): 0 x10E3/uL (ref 0.0–0.4)
Eos: 1 %
HIV Screen 4th Generation wRfx: REACTIVE — AB
Hematocrit: 31.7 % — ABNORMAL LOW (ref 34.0–46.6)
Hemoglobin: 10.7 g/dL — ABNORMAL LOW (ref 11.1–15.9)
Hepatitis B Surface Ag: NEGATIVE
Immature Grans (Abs): 0 x10E3/uL (ref 0.0–0.1)
Immature Granulocytes: 0 %
Lymphocytes Absolute: 1.9 x10E3/uL (ref 0.7–3.1)
Lymphs: 37 %
MCH: 29.6 pg (ref 26.6–33.0)
MCHC: 33.8 g/dL (ref 31.5–35.7)
MCV: 88 fL (ref 79–97)
Monocytes Absolute: 0.4 x10E3/uL (ref 0.1–0.9)
Monocytes: 8 %
Neutrophils Absolute: 2.9 x10E3/uL (ref 1.4–7.0)
Neutrophils: 54 %
Platelets: 201 x10E3/uL (ref 150–450)
RBC: 3.61 x10E6/uL — ABNORMAL LOW (ref 3.77–5.28)
RDW: 14.5 % (ref 11.7–15.4)
RPR Ser Ql: REACTIVE — AB
Rh Factor: POSITIVE
Rubella Antibodies, IGG: 1.96 {index}
WBC: 5.3 x10E3/uL (ref 3.4–10.8)

## 2018-07-30 LAB — HIV 1/2 AB DIFFERENTIATION
HIV 1 Ab: POSITIVE — AB
HIV 2 Ab: NEGATIVE
NOTE (HIV CONF MULTIP: POSITIVE

## 2018-07-30 LAB — RPR, QUANT+TP ABS (REFLEX)
Rapid Plasma Reagin, Quant: 1:1 {titer} — ABNORMAL HIGH
T Pallidum Abs: NONREACTIVE

## 2018-07-30 NOTE — Telephone Encounter (Signed)
-----   Message from Calvert Cantor, [place] sent at 07/30/2018 10:50 AM EDT ----- Patient positive for HIV, needs referral to Infectious Disease. I just tried to call her to notify of HIV + results. and left a voicemail for her to call clinic as I am on L&D today. Can you please try to reach her this afternoon? I will put referral in now.  Thanks! SW

## 2018-07-30 NOTE — Telephone Encounter (Signed)
VLTCB  Clayton Bibles, [place] 07/30/18  3:20 PM

## 2018-07-30 NOTE — Telephone Encounter (Signed)
Patient has tested + for HIV and I received alert via Epic  She is sexually active and has an STI  I would like to get her into [facility] to start treatment next week  I am trying to get a hold of practice where she tested +

## 2018-07-30 NOTE — Telephone Encounter (Signed)
Left message on patients vm to return Samantha's call.

## 2018-07-30 NOTE — Telephone Encounter (Signed)
  VLTCB on both mobile and home numbers.  Anna Mason, [place] 07/30/18  6:37 PM

## 2018-07-30 NOTE — Telephone Encounter (Signed)
VLTCB regarding HIV results  Anna Mason, CNM 07/30/18  10:50 AM

## 2018-07-31 LAB — CYTOLOGY - PAP
Diagnosis: NEGATIVE
Diagnosis: REACTIVE

## 2018-07-31 LAB — URINE CULTURE, OB REFLEX

## 2018-07-31 LAB — CULTURE, OB URINE

## 2018-08-02 ENCOUNTER — Telehealth: Payer: Self-pay | Admitting: *Deleted

## 2018-08-02 NOTE — Telephone Encounter (Signed)
Pt called to office re: results. Pt made aware genetic screening has not yet resulted. Pt also made aware of HIV status. Pt unclear as to if she knew results previously as she had positive testing in 2015. Pt states she moved and never found a doctor to manage. Pt states she has appt with ID tomorrow. Pt strongly advised to keep appt to discuss disease and medication management.  Pt states understanding.

## 2018-08-03 ENCOUNTER — Ambulatory Visit: Payer: Medicare Other

## 2018-08-03 ENCOUNTER — Other Ambulatory Visit (HOSPITAL_COMMUNITY)
Admission: RE | Admit: 2018-08-03 | Discharge: 2018-08-03 | Disposition: A | Discharge status: Home / Self Care | Payer: Medicare Other | Source: Ambulatory Visit | Attending: Infectious Diseases | Admitting: Infectious Diseases

## 2018-08-03 ENCOUNTER — Other Ambulatory Visit: Payer: Medicare Other

## 2018-08-03 ENCOUNTER — Other Ambulatory Visit: Payer: Self-pay

## 2018-08-03 DIAGNOSIS — B20 Human immunodeficiency virus [HIV] disease: Secondary | ICD-10-CM

## 2018-08-03 DIAGNOSIS — Z79899 Other long term (current) drug therapy: Secondary | ICD-10-CM

## 2018-08-03 DIAGNOSIS — Z113 Encounter for screening for infections with a predominantly sexual mode of transmission: Secondary | ICD-10-CM | POA: Diagnosis present

## 2018-08-03 DIAGNOSIS — Z114 Encounter for screening for human immunodeficiency virus [HIV]: Secondary | ICD-10-CM

## 2018-08-04 ENCOUNTER — Encounter: Payer: Self-pay | Admitting: Advanced Practice Midwife

## 2018-08-04 LAB — URINALYSIS
Bilirubin Urine: NEGATIVE
Glucose, UA: NEGATIVE
Hgb urine dipstick: NEGATIVE
Ketones, ur: NEGATIVE
Nitrite: NEGATIVE
Protein, ur: NEGATIVE
Specific Gravity, Urine: 1.031 (ref 1.001–1.03)
pH: 5.5 (ref 5.0–8.0)

## 2018-08-04 LAB — T-HELPER CELL (CD4) - (RCID CLINIC ONLY)
CD4 % Helper T Cell: 17 % — ABNORMAL LOW (ref 33–55)
CD4 T Cell Abs: 270 /uL — ABNORMAL LOW (ref 400–2700)

## 2018-08-04 LAB — URINE CYTOLOGY ANCILLARY ONLY
Chlamydia: POSITIVE — AB
Neisseria Gonorrhea: NEGATIVE

## 2018-08-05 ENCOUNTER — Other Ambulatory Visit: Payer: Self-pay

## 2018-08-05 ENCOUNTER — Telehealth: Payer: Self-pay | Admitting: Clinical

## 2018-08-05 ENCOUNTER — Ambulatory Visit: Payer: Medicare Other | Admitting: Clinical

## 2018-08-05 ENCOUNTER — Other Ambulatory Visit: Payer: Self-pay | Admitting: Behavioral Health

## 2018-08-05 ENCOUNTER — Encounter: Payer: Self-pay | Admitting: Advanced Practice Midwife

## 2018-08-05 ENCOUNTER — Telehealth: Payer: Self-pay | Admitting: *Deleted

## 2018-08-05 NOTE — Telephone Encounter (Signed)
Pt says she forgot about her appointment this morning and would like to reschedule. Pt is agreeable to WebEx video visit on Monday, April 27 at 9:00am.

## 2018-08-05 NOTE — BH Specialist Note (Signed)
Attempted to do telehealth visit. Pt does not have MyChart. Left HIPPA-compliant message to call back Asher Muir from [facility] [employer] at [phone].    Integrated Behavioral Health Visit via Telemedicine (Telephone)  08/05/2018 Nadeen Landau [id number]   [place] Knapp

## 2018-08-05 NOTE — Telephone Encounter (Signed)
Pt left a message stating she is returning a call from [place] and is requesting a call back.

## 2018-08-06 NOTE — BH Specialist Note (Signed)
Attempt to call patient to give instructions for Webex visit today, as patient is not currently on MyChart.   Left HIPPA-compliant message to call back Asher Muir from [facility] [employer] at [phone].   Integrated Behavioral Health Initial Visit  MRN: [id number] Name: Anna Mason [facility], LCSW

## 2018-08-08 ENCOUNTER — Encounter: Payer: Self-pay | Admitting: Advanced Practice Midwife

## 2018-08-09 ENCOUNTER — Other Ambulatory Visit: Payer: Self-pay

## 2018-08-09 ENCOUNTER — Ambulatory Visit: Payer: Medicare Other | Admitting: Clinical

## 2018-08-11 LAB — QUANTIFERON-TB GOLD PLUS
Mitogen-NIL: >10 IU/mL
NIL: 0.03 IU/mL
QuantiFERON-TB Gold Plus: NEGATIVE
TB1-NIL: 0 IU/mL
TB2-NIL: 0.02 IU/mL

## 2018-08-11 LAB — HIV ANTIBODY (ROUTINE TESTING W REFLEX): HIV 1&2 Ab, 4th Generation: REACTIVE — AB

## 2018-08-11 LAB — LIPID PANEL
Cholesterol: 130 mg/dL (ref <200)
HDL: 39 mg/dL — ABNORMAL LOW (ref >=50)
LDL Cholesterol (Calc): 72 mg/dL (calc)
Non-HDL Cholesterol (Calc): 91 mg/dL (calc) (ref <130)
Total CHOL/HDL Ratio: 3.3 (calc) (ref <5.0)
Triglycerides: 106 mg/dL (ref <150)

## 2018-08-11 LAB — CBC WITH DIFFERENTIAL/PLATELET
Absolute Monocytes: 433 {cells}/uL (ref 200–950)
Basophils Absolute: 8 {cells}/uL (ref 0–200)
Basophils Relative: 0.2 %
Eosinophils Absolute: 29 {cells}/uL (ref 15–500)
Eosinophils Relative: 0.7 %
HCT: 29.6 % — ABNORMAL LOW (ref 35.0–45.0)
Hemoglobin: 10.1 g/dL — ABNORMAL LOW (ref 11.7–15.5)
Lymphs Abs: 1667 {cells}/uL (ref 850–3900)
MCH: 30 pg (ref 27.0–33.0)
MCHC: 34.1 g/dL (ref 32.0–36.0)
MCV: 87.8 fL (ref 80.0–100.0)
MPV: 10.8 fL (ref 7.5–12.5)
Monocytes Relative: 10.3 %
Neutro Abs: 2062 {cells}/uL (ref 1500–7800)
Neutrophils Relative %: 49.1 %
Platelets: 208 Thousand/uL (ref 140–400)
RBC: 3.37 Million/uL — ABNORMAL LOW (ref 3.80–5.10)
RDW: 14.5 % (ref 11.0–15.0)
Total Lymphocyte: 39.7 %
WBC: 4.2 Thousand/uL (ref 3.8–10.8)

## 2018-08-11 LAB — COMPLETE METABOLIC PANEL WITHOUT GFR
AG Ratio: 0.7 (calc) — ABNORMAL LOW (ref 1.0–2.5)
ALT: 9 U/L (ref 6–29)
AST: 12 U/L (ref 10–30)
Albumin: 3.4 g/dL — ABNORMAL LOW (ref 3.6–5.1)
Alkaline phosphatase (APISO): 31 U/L (ref 31–125)
BUN: 13 mg/dL (ref 7–25)
CO2: 23 mmol/L (ref 20–32)
Calcium: 8.8 mg/dL (ref 8.6–10.2)
Chloride: 107 mmol/L (ref 98–110)
Creat: 0.63 mg/dL (ref 0.50–1.10)
GFR, Est African American: 148 mL/min/1.73m2
GFR, Est Non African American: 127 mL/min/1.73m2
Globulin: 5.1 g/dL — ABNORMAL HIGH (ref 1.9–3.7)
Glucose, Bld: 81 mg/dL (ref 65–99)
Potassium: 4.1 mmol/L (ref 3.5–5.3)
Sodium: 133 mmol/L — ABNORMAL LOW (ref 135–146)
Total Bilirubin: 0.3 mg/dL (ref 0.2–1.2)
Total Protein: 8.5 g/dL — ABNORMAL HIGH (ref 6.1–8.1)

## 2018-08-11 LAB — HEPATITIS A ANTIBODY, TOTAL: Hepatitis A AB,Total: REACTIVE — AB

## 2018-08-11 LAB — HEPATITIS C ANTIBODY
Hepatitis C Ab: NONREACTIVE
SIGNAL TO CUT-OFF: 0.22

## 2018-08-11 LAB — HEPATITIS B CORE ANTIBODY, TOTAL: Hep B Core Total Ab: NONREACTIVE

## 2018-08-11 LAB — RPR: RPR Ser Ql: NONREACTIVE

## 2018-08-11 LAB — HIV-1 RNA ULTRAQUANT REFLEX TO GENTYP+
HIV 1 RNA Quant: 6110 copies/mL — ABNORMAL HIGH
HIV-1 RNA Quant, Log: 3.79 Log copies/mL — ABNORMAL HIGH

## 2018-08-11 LAB — HEPATITIS B SURFACE ANTIBODY,QUALITATIVE: Hep B S Ab: NONREACTIVE

## 2018-08-11 LAB — HEPATITIS B SURFACE ANTIGEN: Hepatitis B Surface Ag: NONREACTIVE

## 2018-08-11 LAB — HLA B*5701: HLA-B*5701 w/rflx HLA-B High: NEGATIVE

## 2018-08-11 LAB — HIV-1/2 AB - DIFFERENTIATION
HIV-1 antibody: POSITIVE — AB
HIV-2 Ab: NEGATIVE

## 2018-08-11 LAB — HIV-1 GENOTYPE: HIV-1 Genotype: DETECTED — AB

## 2018-08-17 ENCOUNTER — Encounter: Payer: Self-pay | Admitting: Infectious Diseases

## 2018-08-19 ENCOUNTER — Ambulatory Visit: Payer: Medicare Other | Admitting: Pharmacist

## 2018-08-19 ENCOUNTER — Telehealth: Payer: Self-pay | Admitting: Pharmacy Technician

## 2018-08-19 ENCOUNTER — Encounter: Payer: Medicare Other | Admitting: Infectious Diseases

## 2018-08-19 NOTE — Telephone Encounter (Signed)
RCID Patient Product/process development scientist completed.    The patient is insured through [facility] and has a $3.90 copay.  Anna Mason. [name], $3.90 Specialty Pharmacy Patient [facility] for Infectious Disease Phone: [phone] Fax:  [phone]

## 2018-08-19 NOTE — Telephone Encounter (Signed)
RCID Patient Advocate Encounter    Findings of the benefits investigation conducted this morning via test claims for the patient's upcoming appointment on 08/19/2018 @10 :00am are as follows:   Insurance: [employer] -active status Test run with drug historically used, will run another test claim once provider has spoken with patient  Estimated copay amount: $3.90 (specialty copay) $1.30 (maintenance meds) Prior Authorization: appears to not be needed for these medications, tbd at time of appointment  [facility] Patient Advocate will follow up once patient arrives for their appointment.  Beulah Gandy, CPhT Specialty Pharmacy Patient [facility] for Infectious Disease Phone: [phone] Fax: [phone] 08/19/2018 8:17 AM

## 2018-08-30 ENCOUNTER — Other Ambulatory Visit: Payer: Self-pay | Admitting: Obstetrics

## 2018-08-30 MED ORDER — BLOOD PRESSURE MONITOR KIT: 1.0000 | PACK | Freq: Every day | 0 refills | Status: DC

## 2018-08-31 ENCOUNTER — Telehealth: Payer: Self-pay | Admitting: Infectious Diseases

## 2018-08-31 NOTE — Telephone Encounter (Signed)
COVID-19 Pre-Screening Questions: ° °Do you currently have a fever (>100 °F), chills or unexplained body aches? No  ° °Are you currently experiencing new cough, shortness of breath, sore throat, runny nose? No  °•  °Have you recently travelled outside the state of [place] in the last 14 days? No  °•  °Have you been in contact with someone that is currently pending confirmation of Covid19 testing or has been confirmed to have the Covid19 virus?  No  °

## 2018-09-01 ENCOUNTER — Ambulatory Visit (INDEPENDENT_AMBULATORY_CARE_PROVIDER_SITE_OTHER): Payer: Medicare Other | Admitting: Infectious Diseases

## 2018-09-01 ENCOUNTER — Encounter: Payer: Self-pay | Admitting: Infectious Diseases

## 2018-09-01 ENCOUNTER — Ambulatory Visit: Payer: Medicare Other | Admitting: Pharmacist

## 2018-09-01 ENCOUNTER — Ambulatory Visit: Payer: Medicare Other | Admitting: *Deleted

## 2018-09-01 ENCOUNTER — Telehealth: Payer: Self-pay | Admitting: *Deleted

## 2018-09-01 ENCOUNTER — Other Ambulatory Visit: Payer: Self-pay

## 2018-09-01 VITALS — BP 123/72 | HR 73 | Temp 97.7°F | Wt 167.0 lb

## 2018-09-01 DIAGNOSIS — Z3A16 16 weeks gestation of pregnancy: Secondary | ICD-10-CM | POA: Diagnosis not present

## 2018-09-01 DIAGNOSIS — Z23 Encounter for immunization: Secondary | ICD-10-CM | POA: Diagnosis not present

## 2018-09-01 DIAGNOSIS — O98812 Other maternal infectious and parasitic diseases complicating pregnancy, second trimester: Secondary | ICD-10-CM | POA: Diagnosis not present

## 2018-09-01 DIAGNOSIS — F250 Schizoaffective disorder, bipolar type: Secondary | ICD-10-CM

## 2018-09-01 DIAGNOSIS — A749 Chlamydial infection, unspecified: Secondary | ICD-10-CM

## 2018-09-01 DIAGNOSIS — B20 Human immunodeficiency virus [HIV] disease: Secondary | ICD-10-CM | POA: Diagnosis not present

## 2018-09-01 MED ORDER — EMTRICITAB-RILPIVIR-TENOFOV AF 200-25-25 MG PO TABS: 1.0000 | ORAL_TABLET | Freq: Every day | ORAL | 1 refills | Status: DC

## 2018-09-01 MED ORDER — AZITHROMYCIN 250 MG PO TABS
1000.0000 mg | ORAL_TABLET | Freq: Once | ORAL | Status: AC
  Administered 2018-09-01: 14:00:00 1000 mg via ORAL
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
## 2018-09-01 NOTE — Patient Instructions (Signed)
Return in 4 weeks for labs and 6 weeks for appointment with Dr. Lorenso Courier. Take odefsey 1 tab daily each day for treatment of infection and miss no doses. Take odefsey with some food (minimum 200 calories) for best result.

## 2018-09-02 ENCOUNTER — Telehealth: Payer: Self-pay | Admitting: *Deleted

## 2018-09-02 NOTE — Telephone Encounter (Signed)
Received a call from Sofiah stating last night she began to have radiating pain that starts at her buttocks and radiates down her right leg causing numbness in her right foot. She states the pain comes and goes, is about a 6 on the 0 to 10 pain scale, does not interfere with her ability to walk but is uncomfortable. On review of her medication profile she has been prescribed pyridoxine but states she never picked it up. Wana states she only has her Odefsey and Prenatal vitamin. Explained to Jakita that pyridoxine may help her with nerve pain and we should try to get it.   Contacted [facility] who stated the patient picked up pyridoxine on 05/03 and paid 2.60. the prenatal vitamin was last picked up on 04/17.

## 2018-09-03 NOTE — Telephone Encounter (Signed)
Order received on 09/01/18 by Dr. Lorenso Courier to evaluate patient for [facility]).  Patient was evaluated on  for CBHCNS. Patient was consented to care at this time.   Frequency / Duration of CBHCN visits: Effective 09/01/18  16mo3, 4 PRN's for complications with disease process/progression, medication changes or concerns   CBHCN will assess for learning needs related to diagnosis and treatment regimen, provide education as needed, fill pill box if needed, address any barriers which may be preventing medication compliance, and communicating with care team including physician and case manager.   Individualized Plan Of Care 09/01/18 to 11/30/18  a. Type of service(s) and care to be delivered: RN Case Management  b. Frequency and duration of service: Effective 09/01/18 5mo3 4prns for complications with disease process/progression, medication changes or concerns . Visits/Contact may be conducted telephonically or in person  c. Activity restrictions: Pt may be up as tolerated and can safely ambulate without the need for a assistive device   d. Safety Measures: Standard Precautions/Infection Control   e. Service Objectives and Goals: Service Objectives are to assist the pt with HIV medication regimen adherence and staying in care with the Infectious Disease Clinic by identifying barriers to care. RN will address the barriers that are  identified by the patient. Patient stated goal is to do whatever she can to prevent her baby from becoming positive.   f. Equipment required: No additional equipment needs at this time   g. Functional Limitations: Vision. Pt has corrective glasses that she wears   h. Rehabilitation potential: Guarded   i. Diet and Nutritional Needs: Regular Diet   j. Medications and treatments: Medications have been reconciled and reviewed and are a part of EPIC electronic file   k. Specific therapies if needed: RN   l. Pertinent diagnoses: HIV (since  2015 without treatment), Pregnancy, Schizoaffective disorder, Bipolar, Hx suicide attempts  m. Expected outcome: Guarded

## 2018-09-03 NOTE — Progress Notes (Signed)
Traveled to [place] [employer] [place] [employer] and got the patient's Odefsey prior to this visit. Home visit was conducted outside the home. Patient has not shared her HIV status. Reviewed with Wendee how to take the medication, ways to reduce side effects and instructions given to begin medication today.

## 2018-09-03 NOTE — Progress Notes (Addendum)
Order received on 09/01/18 by Dr. Prince Rome to evaluate patient for [facility]).  Patient was evaluated on  for CBHCNS. Patient was consented to care at this time.   RN met with client and or caregiver for assessment. RN reviewed Transport planner, Pump Back, Home Safety Management Information Booklet. At this time the patient has declined the Home Safety Management booklet. Home Fire Safety Assessment, Fall Risk Assessment and Suicide Risk Assessment are performed. RN also discussed information on a Living Will, Advanced Directives, and [place]. RN and Client/Designated Party educated/reviewed/signed Client Agreement and Consent for Service form along with Patient Rights and Responsibilities statement. RN developed patient specific and centered care plan. RN provided contact information and reviewed how to receive emergency help after hours for schedule changes, billing questions, reporting of safety issues, falls, concerns or any needs/questions. Standard Precaution and Infection control along with interventions to correct or prevent high risk behaviors instructed to the patient. Client/Caregiver reports understanding and agreement with the above  Frequency / Duration of CBHCN visits: Effective 09/01/18  71mo, 4 PRN's for complications with disease process/progression, medication changes or concerns   CBHCN will assess for learning needs related to diagnosis and treatment regimen, provide education as needed, fill pill box if needed, address any barriers which may be preventing medication compliance, and communicating with care team including physician and case manager.   Individualized Plan Of Care 09/01/18 to 11/30/18  a. Type of service(s) and care to be delivered: RN Case Management  b. Frequency and duration of service: Effective 09/01/18 266mo4prns for complications with disease process/progression, medication  changes or concerns . Visits/Contact may be conducted telephonically or in person  c. Activity restrictions: Pt may be up as tolerated and can safely ambulate without the need for a assistive device   d. Safety Measures: Standard Precautions/Infection Control   e. Service Objectives and Goals: Service Objectives are to assist the pt with HIV medication regimen adherence and staying in care with the Infectious Disease Clinic by identifying barriers to care. RN will address the barriers that are  identified by the patient. Patient stated goal is to do whatever she can to prevent her baby from becoming positive.   f. Equipment required: No additional equipment needs at this time   g. Functional Limitations: Vision. Pt has corrective glasses that she wears   h. Rehabilitation potential: Guarded   i. Diet and Nutritional Needs: Regular Diet   j. Medications and treatments: Medications have been reconciled and reviewed and are a part of EPIC electronic file   k. Specific therapies if needed: RN   l. Pertinent diagnoses: HIV (since 2015 without treatment), Pregnancy, Schizoaffective disorder, Bipolar, Hx suicide attempts  m. Expected outcome: Guarded

## 2018-09-22 ENCOUNTER — Encounter: Payer: Self-pay | Admitting: Certified Nurse Midwife

## 2018-09-22 ENCOUNTER — Telehealth (HOSPITAL_COMMUNITY): Payer: Self-pay | Admitting: *Deleted

## 2018-09-22 ENCOUNTER — Other Ambulatory Visit: Payer: Self-pay

## 2018-09-22 ENCOUNTER — Ambulatory Visit (HOSPITAL_COMMUNITY)
Admission: RE | Admit: 2018-09-22 | Discharge: 2018-09-22 | Disposition: A | Discharge status: Home / Self Care | Payer: Medicare Other | Source: Ambulatory Visit | Attending: Obstetrics and Gynecology | Admitting: Obstetrics and Gynecology

## 2018-09-22 DIAGNOSIS — O98712 Human immunodeficiency virus [HIV] disease complicating pregnancy, second trimester: Secondary | ICD-10-CM | POA: Diagnosis not present

## 2018-09-22 DIAGNOSIS — Z363 Encounter for antenatal screening for malformations: Secondary | ICD-10-CM | POA: Diagnosis not present

## 2018-09-22 DIAGNOSIS — Z3480 Encounter for supervision of other normal pregnancy, unspecified trimester: Secondary | ICD-10-CM | POA: Insufficient documentation

## 2018-09-22 DIAGNOSIS — Z3A19 19 weeks gestation of pregnancy: Secondary | ICD-10-CM | POA: Diagnosis not present

## 2018-09-23 ENCOUNTER — Other Ambulatory Visit (HOSPITAL_COMMUNITY): Payer: Self-pay | Admitting: *Deleted

## 2018-09-23 DIAGNOSIS — Z362 Encounter for other antenatal screening follow-up: Secondary | ICD-10-CM

## 2018-09-27 ENCOUNTER — Ambulatory Visit: Payer: Medicare Other

## 2018-09-27 ENCOUNTER — Telehealth: Payer: Self-pay | Admitting: *Deleted

## 2018-09-27 DIAGNOSIS — Z5329 Procedure and treatment not carried out because of patient's decision for other reasons: Secondary | ICD-10-CM

## 2018-09-27 DIAGNOSIS — Z91199 Patient's noncompliance with other medical treatment and regimen due to unspecified reason: Secondary | ICD-10-CM

## 2018-09-27 NOTE — Telephone Encounter (Signed)
Received a call from Taney stating she is out of her medications. Contacted [facility] and arranged a delivery of her [place] for today. Informed Zanyla to be on the look out for her medications by mail on today.

## 2018-09-27 NOTE — Progress Notes (Signed)
Unable to reach patient for virtual visit. Will reschedule as soon as possible.  Wende Mott, CNM 09/27/18 2:09 PM

## 2018-09-28 ENCOUNTER — Telehealth: Payer: Self-pay | Admitting: *Deleted

## 2018-09-29 ENCOUNTER — Telehealth: Payer: Self-pay | Admitting: *Deleted

## 2018-09-29 NOTE — Telephone Encounter (Signed)
Followed up with Anna Mason on her mailed medications . Anna Mason stated she was told the medication was suppose to be mailed but she has not received it yet.

## 2018-09-29 NOTE — Telephone Encounter (Signed)
Contacted [facility] to confirm [employer] delivery date. [facility] stated the medications were picked up on the 15th and should arrive by the end of today. Informed Anna Mason of this information.

## 2018-09-30 ENCOUNTER — Telehealth: Payer: Self-pay

## 2018-09-30 NOTE — Telephone Encounter (Signed)
Pt called stating that she noticed 3 small spots of blood after using the restroom this am. Pt states that has not had anything in the vagina, no IC, and no UTI symptoms. She states that she is feeling her baby move well. I advised patient to monitor symptoms, and if bleeding starts back to go to [facility] to be evaluated.

## 2018-10-04 ENCOUNTER — Encounter: Payer: Medicare Other | Admitting: Obstetrics and Gynecology

## 2018-10-08 ENCOUNTER — Telehealth: Payer: Self-pay | Admitting: Obstetrics and Gynecology

## 2018-10-19 ENCOUNTER — Telehealth: Payer: Self-pay | Admitting: *Deleted

## 2018-10-19 NOTE — Telephone Encounter (Signed)
Call placed to [place] today without an answer. Followed up with a texted message asking her how she has been. I would like to find out the reason for her missed visits (OB and ID) so we can discuss a plan for getting her to the appts.

## 2018-10-20 ENCOUNTER — Other Ambulatory Visit: Payer: Self-pay

## 2018-10-20 ENCOUNTER — Ambulatory Visit (HOSPITAL_COMMUNITY)
Admission: RE | Admit: 2018-10-20 | Discharge: 2018-10-20 | Disposition: A | Discharge status: Home / Self Care | Payer: Medicare Other | Source: Ambulatory Visit | Attending: Obstetrics and Gynecology | Admitting: Obstetrics and Gynecology

## 2018-10-20 DIAGNOSIS — Z3A23 23 weeks gestation of pregnancy: Secondary | ICD-10-CM

## 2018-10-20 DIAGNOSIS — Z362 Encounter for other antenatal screening follow-up: Secondary | ICD-10-CM | POA: Diagnosis present

## 2018-10-20 DIAGNOSIS — O98712 Human immunodeficiency virus [HIV] disease complicating pregnancy, second trimester: Secondary | ICD-10-CM | POA: Diagnosis not present

## 2018-10-21 ENCOUNTER — Encounter: Payer: Self-pay | Admitting: Family Medicine

## 2018-10-21 ENCOUNTER — Other Ambulatory Visit (HOSPITAL_COMMUNITY): Payer: Self-pay | Admitting: *Deleted

## 2018-10-21 ENCOUNTER — Encounter: Payer: Medicare Other | Admitting: Family Medicine

## 2018-10-21 DIAGNOSIS — Z362 Encounter for other antenatal screening follow-up: Secondary | ICD-10-CM

## 2018-10-21 NOTE — Progress Notes (Signed)
Patient did not keep appointment today. She will be called to reschedule.  

## 2018-10-26 ENCOUNTER — Encounter: Payer: Medicare Other | Admitting: Obstetrics & Gynecology

## 2018-10-27 ENCOUNTER — Other Ambulatory Visit: Payer: Self-pay | Admitting: *Deleted

## 2018-10-27 DIAGNOSIS — B20 Human immunodeficiency virus [HIV] disease: Secondary | ICD-10-CM

## 2018-10-27 MED ORDER — EMTRICITAB-RILPIVIR-TENOFOV AF 200-25-25 MG PO TABS: 1.0000 | ORAL_TABLET | Freq: Every day | ORAL | 5 refills | Status: DC

## 2018-11-01 ENCOUNTER — Other Ambulatory Visit: Payer: Self-pay

## 2018-11-01 ENCOUNTER — Other Ambulatory Visit: Payer: Self-pay | Admitting: Pharmacist

## 2018-11-01 ENCOUNTER — Ambulatory Visit (INDEPENDENT_AMBULATORY_CARE_PROVIDER_SITE_OTHER): Payer: Medicare Other | Admitting: Infectious Diseases

## 2018-11-01 ENCOUNTER — Encounter: Payer: Self-pay | Admitting: Infectious Diseases

## 2018-11-01 VITALS — BP 106/68 | HR 80 | Temp 98.0°F | Ht 64.0 in | Wt 170.0 lb

## 2018-11-01 DIAGNOSIS — Z91199 Patient's noncompliance with other medical treatment and regimen due to unspecified reason: Secondary | ICD-10-CM

## 2018-11-01 DIAGNOSIS — F250 Schizoaffective disorder, bipolar type: Secondary | ICD-10-CM | POA: Diagnosis not present

## 2018-11-01 DIAGNOSIS — B20 Human immunodeficiency virus [HIV] disease: Secondary | ICD-10-CM | POA: Diagnosis not present

## 2018-11-01 DIAGNOSIS — Z3A24 24 weeks gestation of pregnancy: Secondary | ICD-10-CM | POA: Diagnosis not present

## 2018-11-01 DIAGNOSIS — Z23 Encounter for immunization: Secondary | ICD-10-CM | POA: Diagnosis not present

## 2018-11-01 DIAGNOSIS — Z9119 Patient's noncompliance with other medical treatment and regimen: Secondary | ICD-10-CM

## 2018-11-01 MED ORDER — EMTRICITAB-RILPIVIR-TENOFOV AF 200-25-25 MG PO TABS: 1.0000 | ORAL_TABLET | Freq: Every day | ORAL | 5 refills | Status: DC

## 2018-11-01 MED FILL — ODEFSEY 200-25-25 MG TABS: 200-25-25 | 30 days supply | Qty: 30 | Fill #0

## 2018-11-01 NOTE — Patient Instructions (Addendum)
Return to clinic in 3 weeks for lab draw. Return to clinic in 4 weeks for appointment with Dr. Prince Rome. Do not miss any doses of odefsey.
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
## 2018-11-03 ENCOUNTER — Other Ambulatory Visit: Payer: Self-pay | Admitting: *Deleted

## 2018-11-04 ENCOUNTER — Ambulatory Visit (INDEPENDENT_AMBULATORY_CARE_PROVIDER_SITE_OTHER): Payer: Medicare Other | Admitting: Obstetrics and Gynecology

## 2018-11-04 ENCOUNTER — Other Ambulatory Visit (HOSPITAL_COMMUNITY)
Admission: RE | Admit: 2018-11-04 | Discharge: 2018-11-04 | Disposition: A | Discharge status: Home / Self Care | Payer: Medicare Other | Source: Ambulatory Visit | Attending: Obstetrics and Gynecology | Admitting: Obstetrics and Gynecology

## 2018-11-04 ENCOUNTER — Encounter: Payer: Self-pay | Admitting: Obstetrics and Gynecology

## 2018-11-04 ENCOUNTER — Other Ambulatory Visit: Payer: Self-pay

## 2018-11-04 VITALS — BP 115/70 | HR 77 | Wt 172.0 lb

## 2018-11-04 DIAGNOSIS — O0992 Supervision of high risk pregnancy, unspecified, second trimester: Secondary | ICD-10-CM

## 2018-11-04 DIAGNOSIS — O0990 Supervision of high risk pregnancy, unspecified, unspecified trimester: Secondary | ICD-10-CM | POA: Insufficient documentation

## 2018-11-04 DIAGNOSIS — Z21 Asymptomatic human immunodeficiency virus [HIV] infection status: Secondary | ICD-10-CM

## 2018-11-04 DIAGNOSIS — O98812 Other maternal infectious and parasitic diseases complicating pregnancy, second trimester: Secondary | ICD-10-CM

## 2018-11-04 DIAGNOSIS — A749 Chlamydial infection, unspecified: Secondary | ICD-10-CM

## 2018-11-04 DIAGNOSIS — Z3A25 25 weeks gestation of pregnancy: Secondary | ICD-10-CM

## 2018-11-04 NOTE — Telephone Encounter (Signed)
Telephone call  

## 2018-11-04 NOTE — Progress Notes (Signed)
Subjective:  Anna Mason is a 23 y.o. G1P0 at [redacted]w[redacted]d being seen today for ongoing prenatal care.  She is currently monitored for the following issues for this high-risk pregnancy and has Anticholinergic drug overdose; MDD (major depressive disorder), recurrent episode, severe ([place]); Suicidal ideation; Suicide attempt ([place]); Anxiety state, unspecified; Supervision of high risk pregnancy, antepartum; Chlamydia; and HIV positive ([place]) on their problem list.  Patient reports no complaints.  Contractions: Not present. Vag. Bleeding: None.  Movement: Present. Denies leaking of fluid.   The following portions of the patient's history were reviewed and updated as appropriate: allergies, current medications, past family history, past medical history, past social history, past surgical history and problem list. Problem list updated.  Objective:   Vitals:   11/04/18 1619  BP: 115/70  Pulse: 77  Weight: 172 lb (78 kg)    Fetal Status: Fetal Heart Rate (bpm): 150   Movement: Present     General:  Alert, oriented and cooperative. Patient is in no acute distress.  Skin: Skin is warm and dry. No rash noted.   Cardiovascular: Normal heart rate noted  Respiratory: Normal respiratory effort, no problems with respiration noted  Abdomen: Soft, gravid, appropriate for gestational age. Pain/Pressure: Absent     Pelvic:  Cervical exam deferred        Extremities: Normal range of motion.     Mental Status: Normal mood and affect. Normal behavior. Normal judgment and thought content.   Urinalysis:      Assessment and Plan:  Pregnancy: G1P0 at [redacted]w[redacted]d  1. Supervision of high risk pregnancy, antepartum Stable Glucola next visit - Urine cytology ancillary only([place])  2. HIV positive ([place]) Followed by ID Growth scan next month  3. Chlamydia TOC today - Urine cytology ancillary only([place])  Preterm labor symptoms and general obstetric precautions including but not limited to vaginal  bleeding, contractions, leaking of fluid and fetal movement were reviewed in detail with the patient. Please refer to After Visit Summary for other counseling recommendations.  Return in about 3 weeks (around 11/25/2018) for OB visit,face to face for Glucola.   Chancy Milroy, MD

## 2018-11-04 NOTE — Patient Instructions (Signed)

## 2018-11-06 LAB — URINE CYTOLOGY ANCILLARY ONLY
Chlamydia: POSITIVE — AB
Neisseria Gonorrhea: NEGATIVE

## 2018-11-08 ENCOUNTER — Other Ambulatory Visit: Payer: Self-pay

## 2018-11-08 DIAGNOSIS — A749 Chlamydial infection, unspecified: Secondary | ICD-10-CM

## 2018-11-08 MED ORDER — AZITHROMYCIN 500 MG PO TABS: 1000.0000 mg | ORAL_TABLET | Freq: Once | ORAL | 0 refills | Status: AC

## 2018-11-09 ENCOUNTER — Telehealth: Payer: Self-pay

## 2018-11-09 MED FILL — AZITHROMYCIN 500 MG TABS: 500 | 1 days supply | Qty: 2 | Fill #0

## 2018-11-09 NOTE — Telephone Encounter (Signed)
Patient notified of results Pt advised will need TOC in 4-6 weeks Pt voiced understanding  Rx sent and forms faxed to [facility].

## 2018-11-17 ENCOUNTER — Ambulatory Visit (HOSPITAL_COMMUNITY): Payer: Medicare Other | Admitting: *Deleted

## 2018-11-17 ENCOUNTER — Ambulatory Visit (HOSPITAL_COMMUNITY)
Admission: RE | Admit: 2018-11-17 | Discharge: 2018-11-17 | Disposition: A | Discharge status: Home / Self Care | Payer: Medicare Other | Source: Ambulatory Visit | Attending: Obstetrics and Gynecology | Admitting: Obstetrics and Gynecology

## 2018-11-17 ENCOUNTER — Encounter (HOSPITAL_COMMUNITY): Payer: Self-pay

## 2018-11-17 ENCOUNTER — Other Ambulatory Visit: Payer: Self-pay

## 2018-11-17 DIAGNOSIS — O0990 Supervision of high risk pregnancy, unspecified, unspecified trimester: Secondary | ICD-10-CM | POA: Diagnosis present

## 2018-11-17 DIAGNOSIS — Z362 Encounter for other antenatal screening follow-up: Secondary | ICD-10-CM

## 2018-11-17 DIAGNOSIS — Z3A27 27 weeks gestation of pregnancy: Secondary | ICD-10-CM | POA: Diagnosis not present

## 2018-11-17 DIAGNOSIS — O98712 Human immunodeficiency virus [HIV] disease complicating pregnancy, second trimester: Secondary | ICD-10-CM

## 2018-11-18 ENCOUNTER — Other Ambulatory Visit (HOSPITAL_COMMUNITY): Payer: Self-pay | Admitting: *Deleted

## 2018-11-18 DIAGNOSIS — Z362 Encounter for other antenatal screening follow-up: Secondary | ICD-10-CM

## 2018-11-22 ENCOUNTER — Other Ambulatory Visit: Payer: Medicare Other

## 2018-11-25 ENCOUNTER — Other Ambulatory Visit: Payer: Medicare Other

## 2018-11-25 ENCOUNTER — Encounter: Payer: Medicare Other | Admitting: Obstetrics & Gynecology

## 2018-11-26 ENCOUNTER — Telehealth: Payer: Self-pay | Admitting: Obstetrics & Gynecology

## 2018-11-30 ENCOUNTER — Ambulatory Visit: Payer: Medicare Other | Admitting: Infectious Diseases

## 2018-12-01 ENCOUNTER — Telehealth: Payer: Self-pay | Admitting: *Deleted

## 2018-12-01 DIAGNOSIS — B20 Human immunodeficiency virus [HIV] disease: Secondary | ICD-10-CM

## 2018-12-01 MED FILL — ODEFSEY 200-25-25 MG TABS: 200-25-25 | 30 days supply | Qty: 30 | Fill #1

## 2018-12-10 ENCOUNTER — Other Ambulatory Visit: Payer: Medicare Other

## 2018-12-13 ENCOUNTER — Other Ambulatory Visit: Payer: Medicare Other

## 2018-12-14 NOTE — Telephone Encounter (Signed)
I signed an order for you to do a home visit for ARV compliance and non-compliance with office visits. I wasn't aware these sort of orders were needed from me or timed out. Hope this is what you needed.

## 2018-12-14 NOTE — Addendum Note (Signed)
Addended by: Janine Ores on: 12/14/2018 11:04 AM   Modules accepted: Orders

## 2018-12-14 NOTE — Telephone Encounter (Addendum)
LATE ENTRY PATIENT ON HOLD  PRIOR ORDERS ENDED ON 11/30/18. A VISIT WITH THE PATIENT HAS NOT BEEN MADE WITHOUT ORDERS IN PLACE  Plan of Care orders have expired effective 12/01/18 but GOALS have not been completely meet at this time. I would like to connect with the patient and received further orders from MD. If I am unable to get in contact with her within the next 30 days I will have to discharge at that time. RN WILL NOT RESUME CARE UNTIL NEW MD ORDERS OBTAINED AND PATIENT ABLE/WILLING TO RE-ENGAGE IN CARE

## 2018-12-16 ENCOUNTER — Ambulatory Visit (HOSPITAL_COMMUNITY)
Admission: RE | Admit: 2018-12-16 | Discharge: 2018-12-16 | Disposition: A | Discharge status: Home / Self Care | Payer: Medicare Other | Source: Ambulatory Visit | Attending: Obstetrics and Gynecology | Admitting: Obstetrics and Gynecology

## 2018-12-16 ENCOUNTER — Other Ambulatory Visit: Payer: Self-pay

## 2018-12-16 ENCOUNTER — Ambulatory Visit (HOSPITAL_COMMUNITY): Payer: Medicare Other | Admitting: *Deleted

## 2018-12-16 ENCOUNTER — Other Ambulatory Visit (HOSPITAL_COMMUNITY): Payer: Self-pay | Admitting: *Deleted

## 2018-12-16 ENCOUNTER — Encounter (HOSPITAL_COMMUNITY): Payer: Self-pay

## 2018-12-16 DIAGNOSIS — O98713 Human immunodeficiency virus [HIV] disease complicating pregnancy, third trimester: Secondary | ICD-10-CM

## 2018-12-16 DIAGNOSIS — O0990 Supervision of high risk pregnancy, unspecified, unspecified trimester: Secondary | ICD-10-CM

## 2018-12-16 DIAGNOSIS — Z362 Encounter for other antenatal screening follow-up: Secondary | ICD-10-CM | POA: Diagnosis present

## 2018-12-16 DIAGNOSIS — O98719 Human immunodeficiency virus [HIV] disease complicating pregnancy, unspecified trimester: Secondary | ICD-10-CM

## 2018-12-16 DIAGNOSIS — Z3A31 31 weeks gestation of pregnancy: Secondary | ICD-10-CM

## 2018-12-27 MED FILL — ODEFSEY 200-25-25 MG TABS: 200-25-25 | 30 days supply | Qty: 30 | Fill #2

## 2018-12-30 ENCOUNTER — Encounter: Payer: Medicare Other | Admitting: Infectious Diseases

## 2019-01-05 ENCOUNTER — Other Ambulatory Visit: Payer: Medicare Other

## 2019-01-13 ENCOUNTER — Encounter (HOSPITAL_COMMUNITY): Payer: Self-pay

## 2019-01-13 ENCOUNTER — Ambulatory Visit (HOSPITAL_COMMUNITY)
Admission: RE | Admit: 2019-01-13 | Discharge: 2019-01-13 | Disposition: A | Discharge status: Home / Self Care | Payer: Medicare Other | Source: Ambulatory Visit | Attending: Obstetrics | Admitting: Obstetrics

## 2019-01-13 ENCOUNTER — Other Ambulatory Visit: Payer: Medicare Other

## 2019-01-13 ENCOUNTER — Ambulatory Visit (HOSPITAL_COMMUNITY): Payer: Medicare Other

## 2019-01-17 ENCOUNTER — Other Ambulatory Visit: Payer: Medicare Other

## 2019-01-17 ENCOUNTER — Ambulatory Visit (INDEPENDENT_AMBULATORY_CARE_PROVIDER_SITE_OTHER): Payer: Medicare Other | Admitting: Advanced Practice Midwife

## 2019-01-17 ENCOUNTER — Other Ambulatory Visit (HOSPITAL_COMMUNITY)
Admission: RE | Admit: 2019-01-17 | Discharge: 2019-01-17 | Disposition: A | Discharge status: Home / Self Care | Payer: Medicare Other | Source: Ambulatory Visit | Attending: Advanced Practice Midwife | Admitting: Advanced Practice Midwife

## 2019-01-17 ENCOUNTER — Other Ambulatory Visit: Payer: Self-pay

## 2019-01-17 VITALS — BP 124/71 | HR 75 | Wt 184.0 lb

## 2019-01-17 DIAGNOSIS — O98813 Other maternal infectious and parasitic diseases complicating pregnancy, third trimester: Secondary | ICD-10-CM | POA: Diagnosis not present

## 2019-01-17 DIAGNOSIS — A749 Chlamydial infection, unspecified: Secondary | ICD-10-CM | POA: Diagnosis not present

## 2019-01-17 DIAGNOSIS — O0990 Supervision of high risk pregnancy, unspecified, unspecified trimester: Secondary | ICD-10-CM | POA: Diagnosis present

## 2019-01-17 DIAGNOSIS — O0993 Supervision of high risk pregnancy, unspecified, third trimester: Secondary | ICD-10-CM

## 2019-01-17 DIAGNOSIS — Z21 Asymptomatic human immunodeficiency virus [HIV] infection status: Secondary | ICD-10-CM

## 2019-01-17 DIAGNOSIS — Z3A35 35 weeks gestation of pregnancy: Secondary | ICD-10-CM

## 2019-01-17 MED ORDER — AZITHROMYCIN 500 MG PO TABS
1000.0000 mg | ORAL_TABLET | Freq: Once | ORAL | Status: AC
  Administered 2019-01-17: 1000 mg via ORAL

## 2019-01-17 NOTE — Patient Instructions (Signed)
Expedited Partner Therapy:  °Information Sheet for Patients and Partners  °            ° °You have been offered expedited partner therapy (EPT). This information sheet contains important information and warnings you need to be aware of, so please read it carefully.  ° °Expedited Partner Therapy (EPT) is the clinical practice of treating the sexual partners of persons who receive chlamydia, gonorrhea, or trichomoniasis diagnoses by providing medications or prescriptions to the patient. Patients then provide partners with these therapies without the health-care provider having examined the partner. In other words, EPT is a convenient, fast and private way for patients to help their sexual partners get treated.  ° °Chlamydia and gonorrhea are bacterial infections you get from having sex with a person who is already infected. Trichomoniasis (or “trich”) is a very common sexually transmitted infection (STI) that is caused by infection with a protozoan parasite called Trichomonas vaginalis.  Many people with these infections don’t know it because they feel fine, but without treatment these infections can cause serious health problems, such as pelvic inflammatory disease, ectopic pregnancy, infertility and increased risk of HIV.  ° °It is important to get treated as soon as possible to protect your health, to avoid spreading these infections to others, and to prevent yourself from becoming re-infected. The good news is these infections can be easily cured with proper antibiotic medicine. The best way to take care of your self is to see a doctor or go to your local health department. If you are not able to see a doctor or other medical provider, you should take EPT.  ° ° °Recommended Medication: °EPT for Chlamydia:  Azithromycin (Zithromax) 1 gram orally in a single dose °EPT for Gonorrhea:  Cefixime (Suprax) 400 milligrams orally in a single dose PLUS azithromycin (Zithromax) 1 gram orally in a single dose °EPT for  Trichomoniasis:  Metronidazole (Flagyl) 2 grams orally in a single dose ° ° °These medicines are very safe. However, you should not take them if you have ever had an allergic reaction (like a rash) to any of these medicines: azithromycin (Zithromax), erythromycin, clarithromycin (Biaxin), metronidazole (Flagyl), tinidazole (Tindimax). If you are uncertain about whether you have an allergy, call your medical provider or pharmacist before taking this medicine. If you have a serious, long-term illness like kidney, liver or heart disease, colitis or stomach problems, or you are currently taking other prescription medication, talk to your provider before taking this medication.  ° °Women: If you have lower belly pain, pain during sex, vomiting, or a fever, do not take this medicine. Instead, you should see a medical provider to be certain you do not have pelvic inflammatory disease (PID). PID can be serious and lead to infertility, pregnancy problems or chronic pelvic pain.  ° °Pregnant Women: It is very important for you to see a doctor to get pregnancy services and pre-natal care. These antibiotics for EPT are safe for pregnant women, but you still need to see a medical provider as soon as possible. It is also important to note that Doxycycline is an alternative therapy for chlamydia, but it should not be taken by someone who is pregnant.  ° °Men: If you have pain or swelling in the testicles or a fever, do not take this medicine and see a medical provider.    ° °Men who have sex with men (MSM): MSM in [place] continue to experience high rates of syphilis and HIV. Many MSM with gonorrhea or   chlamydia could also have syphilis and/or HIV and not know it. If you are a man who has sex with other men, it is very important that you see a medical provider and are tested for HIV and syphilis. EPT is not recommended for gonorrhea for MSM.  Recommended treatment for gonorrhea for MSM is Rocephin (shot) AND azithromycin  due to decreased cure rate.  Please see your medical provider if this is the case.   ° °Along with this information sheet is a prescription for the medicine. If you receive a prescription it will be in your name and will indicate your date of birth, or it will be in the name of “Expedited Partner Therapy”.   In either case, you can have the prescription filled at a pharmacy. You will be responsible for the cost of the medicine, unless you have prescription drug coverage. In that case, you could provide your name so the pharmacy could bill your health plan.  ° °Take the medication as directed. Some people will have a mild, upset stomach, which does not last long. AVOID alcohol 24 hours after taking metronidazole (Flagyl) to reduce the possibility of a disulfiram-like reaction (severe vomiting and abdominal pain).  After taking the medicine, do not have sex for 7 days. Do not share this medicine or give it to anyone else. It is important to tell everyone you have had sex with in the last 60 days that they need to go and get tested for sexually transmitted infections.  ° °Ways to prevent these and other sexually transmitted infections (STIs):  ° °• Abstain from sex. This is the only sure way to avoid getting an STI.  °• Use barrier methods, such as condoms, consistently and correctly.  °• Limit the number of sexual partners.  °• Have regular physical exams, including testing for STIs.  ° °For more information about EPT or other issues pertaining to an STI, please contact your medical provider or the [place] [facility] at [phone] or [email or web address]   ° °

## 2019-01-17 NOTE — Progress Notes (Signed)
   PRENATAL VISIT NOTE  Subjective:  Anna Mason is a 23 y.o. G1P0 at [redacted]w[redacted]d being seen today for ongoing prenatal care.  She is currently monitored for the following issues for this high-risk pregnancy and has Anticholinergic drug overdose; MDD (major depressive disorder), recurrent episode, severe ([place]); Suicidal ideation; Suicide attempt ([place]); Anxiety state, unspecified; Supervision of high risk pregnancy, antepartum; Chlamydia; and HIV positive ([place]) on their problem list.   Patient reports occasional light vaginal bleeding and spotting associated with pressure. Patient states that once the pressure subsides her bleeding does as well. Denies headaches, SOB, chest pain, lower extremity swelling. Contractions: Not present.  .  Movement: Present. Denies leaking of fluid.   Patient's endorses marijuana use every morning.   The following portions of the patient's history were reviewed and updated as appropriate: allergies, current medications, past family history, past medical history, past social history, past surgical history and problem list.   Objective:   Vitals:   01/17/19 0954  BP: 124/71  Pulse: 75  Weight: 83.5 kg    Fetal Status: Fetal Heart Rate (bpm): 156   Movement: Present     General:  Alert, oriented and cooperative. Patient is in no acute distress.  Skin: Skin is warm and dry. No rash noted.   Cardiovascular: Normal heart rate noted  Respiratory: Normal respiratory effort, no problems with respiration noted  Abdomen: Soft, gravid, appropriate for gestational age.  Pain/Pressure: Present     Pelvic: Cervical exam performed, 1 cm dilated.        Extremities: Normal range of motion.  Edema: None  Mental Status: Normal mood and affect. Normal behavior. Normal judgment and thought content.   Assessment and Plan:  Pregnancy: G1P0 at [redacted]w[redacted]d 1. Supervision of high risk pregnancy, antepartum - Strep Gp B NAA - Cervicovaginal ancillary only( [place]) - Glucose  Tolerance, 2 Hours w/1 Hour - RPR - CBC  2. Chlamydia infection affecting pregnancy in third trimester - Azithromycin (ZITHROMAX) tablet 1,000 mg administered in office due to patient not receiving prior prescription. Patient states that it "was not delivered by [employer]." - Prescription for Azithromycin 1,000 mg given to patient for partner as well. - Education provided about abstinence for 10 days while both individuals are being treated to prevent reinfection.    Term and preterm labor symptoms and general obstetric precautions including but not limited to vaginal bleeding, contractions, leaking of fluid and fetal movement were reviewed in detail with the patient. Please refer to After Visit Summary for other counseling recommendations.   Discussed with patient upcoming Infectious Disease appointment and the importance of being compliant. Patient endorsed understanding. Pending patient's viral load, course of delivery will be determined.   Return in about 2 weeks (around 01/31/2019).    Future Appointments  Date Time Provider [place]  01/31/2019  3:30 PM Constant, Vickii Chafe, MD [place] None    Margarette Asal, [place]

## 2019-01-17 NOTE — Progress Notes (Signed)
Pt reports fetal movement with occasional pressure and reports light pink spotting sometimes after urinating. Administered azithromycin in office, per provider order.

## 2019-01-18 LAB — GLUCOSE TOLERANCE, 2 HOURS W/ 1HR
Glucose, 1 hour: 97 mg/dL (ref 65–179)
Glucose, 2 hour: 92 mg/dL (ref 65–152)
Glucose, Fasting: 82 mg/dL (ref 65–91)

## 2019-01-18 LAB — CBC
Hematocrit: 33.4 % — ABNORMAL LOW (ref 34.0–46.6)
Hemoglobin: 11.4 g/dL (ref 11.1–15.9)
MCH: 31.6 pg (ref 26.6–33.0)
MCHC: 34.1 g/dL (ref 31.5–35.7)
MCV: 93 fL (ref 79–97)
Platelets: 175 10*3/uL (ref 150–450)
RBC: 3.61 x10E6/uL — ABNORMAL LOW (ref 3.77–5.28)
RDW: 13.1 % (ref 11.7–15.4)
WBC: 5.6 10*3/uL (ref 3.4–10.8)

## 2019-01-18 LAB — RPR: RPR Ser Ql: NONREACTIVE

## 2019-01-19 ENCOUNTER — Other Ambulatory Visit: Payer: Medicare Other

## 2019-01-19 ENCOUNTER — Other Ambulatory Visit: Payer: Self-pay

## 2019-01-19 DIAGNOSIS — B20 Human immunodeficiency virus [HIV] disease: Secondary | ICD-10-CM

## 2019-01-19 LAB — STREP GP B NAA: Strep Gp B NAA: NEGATIVE

## 2019-01-20 LAB — CERVICOVAGINAL ANCILLARY ONLY
Chlamydia: POSITIVE — AB
Neisseria Gonorrhea: NEGATIVE

## 2019-01-26 LAB — COMPREHENSIVE METABOLIC PANEL
AG Ratio: 0.9 (calc) — ABNORMAL LOW (ref 1.0–2.5)
ALT: 5 U/L — ABNORMAL LOW (ref 6–29)
AST: 10 U/L (ref 10–30)
Albumin: 3.1 g/dL — ABNORMAL LOW (ref 3.6–5.1)
Alkaline phosphatase (APISO): 76 U/L (ref 31–125)
BUN: 11 mg/dL (ref 7–25)
CO2: 21 mmol/L (ref 20–32)
Calcium: 8.2 mg/dL — ABNORMAL LOW (ref 8.6–10.2)
Chloride: 108 mmol/L (ref 98–110)
Creat: 0.57 mg/dL (ref 0.50–1.10)
Globulin: 3.5 g/dL (calc) (ref 1.9–3.7)
Glucose, Bld: 89 mg/dL (ref 65–99)
Potassium: 3.7 mmol/L (ref 3.5–5.3)
Sodium: 134 mmol/L — ABNORMAL LOW (ref 135–146)
Total Bilirubin: 0.3 mg/dL (ref 0.2–1.2)
Total Protein: 6.6 g/dL (ref 6.1–8.1)

## 2019-01-26 LAB — HIV RNA, RTPCR W/R GT (RTI, PI,INT)
HIV 1 RNA Quant: <20 copies/mL
HIV-1 RNA Quant, Log: <1.3 Log copies/mL

## 2019-01-27 MED FILL — ODEFSEY 200-25-25 MG TABS: 200-25-25 | 30 days supply | Qty: 30 | Fill #3

## 2019-01-31 ENCOUNTER — Encounter: Payer: Medicare Other | Admitting: Obstetrics and Gynecology

## 2019-02-02 ENCOUNTER — Ambulatory Visit (INDEPENDENT_AMBULATORY_CARE_PROVIDER_SITE_OTHER): Payer: Medicare Other | Admitting: Infectious Diseases

## 2019-02-02 ENCOUNTER — Other Ambulatory Visit: Payer: Self-pay

## 2019-02-02 ENCOUNTER — Encounter: Payer: Self-pay | Admitting: Infectious Diseases

## 2019-02-02 VITALS — BP 118/69 | HR 65 | Temp 98.5°F | Wt 189.0 lb

## 2019-02-02 DIAGNOSIS — B20 Human immunodeficiency virus [HIV] disease: Secondary | ICD-10-CM

## 2019-02-02 DIAGNOSIS — Z3A37 37 weeks gestation of pregnancy: Secondary | ICD-10-CM | POA: Diagnosis not present

## 2019-02-02 DIAGNOSIS — Z9119 Patient's noncompliance with other medical treatment and regimen: Secondary | ICD-10-CM

## 2019-02-02 DIAGNOSIS — F250 Schizoaffective disorder, bipolar type: Secondary | ICD-10-CM

## 2019-02-02 DIAGNOSIS — Z91199 Patient's noncompliance with other medical treatment and regimen due to unspecified reason: Secondary | ICD-10-CM

## 2019-02-02 MED ORDER — EMTRICITAB-RILPIVIR-TENOFOV AF 200-25-25 MG PO TABS: 1.0000 | ORAL_TABLET | Freq: Every day | ORAL | 3 refills | Status: DC

## 2019-02-02 NOTE — Patient Instructions (Signed)
Take odefsey daily with food (need ~200 calories with pill). Avoid PPIs and H2 blockers with odefsey. Have labs drawn today. Return to clinic to see Dr. Prince Rome in 2 weeks. Meet with [employer] your [facility] case Engineer, [place]. Go to each one of your OBGYN's appointments as scheduled.
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
## 2019-02-03 ENCOUNTER — Encounter: Payer: Medicare Other | Admitting: Infectious Diseases

## 2019-02-03 LAB — T-HELPER CELLS (CD4) COUNT (NOT AT ARMC)
CD4 % Helper T Cell: 27 % — ABNORMAL LOW (ref 33–65)
CD4 T Cell Abs: 414 /uL (ref 400–1790)

## 2019-02-07 ENCOUNTER — Other Ambulatory Visit: Payer: Self-pay

## 2019-02-07 ENCOUNTER — Ambulatory Visit (INDEPENDENT_AMBULATORY_CARE_PROVIDER_SITE_OTHER): Payer: Medicare Other | Admitting: Obstetrics & Gynecology

## 2019-02-07 VITALS — BP 123/73 | HR 68 | Wt 189.2 lb

## 2019-02-07 DIAGNOSIS — Z21 Asymptomatic human immunodeficiency virus [HIV] infection status: Secondary | ICD-10-CM

## 2019-02-07 DIAGNOSIS — Z3A38 38 weeks gestation of pregnancy: Secondary | ICD-10-CM

## 2019-02-07 DIAGNOSIS — Z23 Encounter for immunization: Secondary | ICD-10-CM | POA: Diagnosis not present

## 2019-02-07 DIAGNOSIS — O0990 Supervision of high risk pregnancy, unspecified, unspecified trimester: Secondary | ICD-10-CM

## 2019-02-07 DIAGNOSIS — O0993 Supervision of high risk pregnancy, unspecified, third trimester: Secondary | ICD-10-CM

## 2019-02-07 LAB — HIV-1 RNA QUANT-NO REFLEX-BLD
HIV 1 RNA Quant: <20 copies/mL — AB
HIV-1 RNA Quant, Log: <1.3 Log copies/mL — AB

## 2019-02-07 LAB — COMPREHENSIVE METABOLIC PANEL
AG Ratio: 0.8 (calc) — ABNORMAL LOW (ref 1.0–2.5)
ALT: 6 U/L (ref 6–29)
AST: 11 U/L (ref 10–30)
Albumin: 3.2 g/dL — ABNORMAL LOW (ref 3.6–5.1)
Alkaline phosphatase (APISO): 89 U/L (ref 31–125)
BUN: 10 mg/dL (ref 7–25)
CO2: 20 mmol/L (ref 20–32)
Calcium: 8.9 mg/dL (ref 8.6–10.2)
Chloride: 108 mmol/L (ref 98–110)
Creat: 0.57 mg/dL (ref 0.50–1.10)
Globulin: 3.9 g/dL (calc) — ABNORMAL HIGH (ref 1.9–3.7)
Glucose, Bld: 81 mg/dL (ref 65–99)
Potassium: 3.9 mmol/L (ref 3.5–5.3)
Sodium: 137 mmol/L (ref 135–146)
Total Bilirubin: 0.3 mg/dL (ref 0.2–1.2)
Total Protein: 7.1 g/dL (ref 6.1–8.1)

## 2019-02-07 NOTE — Patient Instructions (Signed)

## 2019-02-07 NOTE — Progress Notes (Signed)
   PRENATAL VISIT NOTE  Subjective:  Anna Mason is a 23 y.o. G1P0 at [redacted]w[redacted]d being seen today for ongoing prenatal care.  She is currently monitored for the following issues for this high-risk pregnancy and has MDD (major depressive disorder), recurrent episode, severe ([place]); Anxiety state, unspecified; Supervision of high risk pregnancy, antepartum; Chlamydia; HIV positive ([place]); and Non compliance with medical treatment on their problem list.  Patient reports no complaints.  Contractions: Not present. Vag. Bleeding: None.  Movement: Present. Denies leaking of fluid.   The following portions of the patient's history were reviewed and updated as appropriate: allergies, current medications, past family history, past medical history, past social history, past surgical history and problem list.   Objective:   Vitals:   02/07/19 0933  BP: 123/73  Pulse: 68  Weight: 189 lb 3.2 oz (85.8 kg)    Fetal Status: Fetal Heart Rate (bpm): 135   Movement: Present  Presentation: Vertex  General:  Alert, oriented and cooperative. Patient is in no acute distress.  Skin: Skin is warm and dry. No rash noted.   Cardiovascular: Normal heart rate noted  Respiratory: Normal respiratory effort, no problems with respiration noted  Abdomen: Soft, gravid, appropriate for gestational age.  Pain/Pressure: Present     Pelvic: Cervical exam performed Dilation: 1.5 Effacement (%): 40 Station: -3  Extremities: Normal range of motion.  Edema: None  Mental Status: Normal mood and affect. Normal behavior. Normal judgment and thought content.   Assessment and Plan:  Pregnancy: G1P0 at [redacted]w[redacted]d 1. Encounter for immunization routine - Flu Vaccine QUAD 36+ mos IM (Fluarix, Quad PF)  2. Supervision of high risk pregnancy, antepartum HIV pos  3. HIV positive ([place]) Viral load not detectible   Term labor symptoms and general obstetric precautions including but not limited to vaginal bleeding, contractions, leaking of  fluid and fetal movement were reviewed in detail with the patient. Please refer to After Visit Summary for other counseling recommendations.   Return in about 1 week (around 02/14/2019).  Future Appointments  Date Time Provider [place]  02/14/2019  2:00 PM Constant, Vickii Chafe, MD [place] None  02/15/2019  3:30 PM Powers, Evern Core, MD [facility]    Emeterio Reeve, MD

## 2019-02-07 NOTE — Progress Notes (Signed)
Pt presents for ROB. Pt has no concerns. Flu vaccine given today.

## 2019-02-13 ENCOUNTER — Telehealth: Payer: Self-pay | Admitting: *Deleted

## 2019-02-13 NOTE — Telephone Encounter (Signed)
The intent of this communication is to inform the Health Care Team that this patient will be discharged from [facility]).  Greater than 3 attempts have been made to re-engage the patient  without any success .Moving forward, the [facility] will be willing to reopen the patient to services if and when the patient is ready to discuss medication adherence and HIV disease  management. Effective 11/01/20202 patient will be discharged and removed from [facility] active patient listing.   I will continue to try and reach out to the patient and offer assistance

## 2019-02-14 ENCOUNTER — Encounter: Payer: Medicare Other | Admitting: Obstetrics and Gynecology

## 2019-02-14 ENCOUNTER — Inpatient Hospital Stay (HOSPITAL_COMMUNITY)
Admission: AD | Admit: 2019-02-14 | Discharge: 2019-02-17 | DRG: 806 | Disposition: A | Discharge status: Home / Self Care | Payer: Medicare Other | Attending: Family Medicine | Admitting: Family Medicine

## 2019-02-14 ENCOUNTER — Other Ambulatory Visit: Payer: Self-pay

## 2019-02-14 ENCOUNTER — Encounter (HOSPITAL_COMMUNITY): Payer: Self-pay | Admitting: *Deleted

## 2019-02-14 ENCOUNTER — Telehealth: Payer: Self-pay | Admitting: *Deleted

## 2019-02-14 DIAGNOSIS — O0990 Supervision of high risk pregnancy, unspecified, unspecified trimester: Secondary | ICD-10-CM

## 2019-02-14 DIAGNOSIS — O4290 Premature rupture of membranes, unspecified as to length of time between rupture and onset of labor, unspecified weeks of gestation: Secondary | ICD-10-CM | POA: Diagnosis present

## 2019-02-14 DIAGNOSIS — Z3A39 39 weeks gestation of pregnancy: Secondary | ICD-10-CM | POA: Diagnosis not present

## 2019-02-14 DIAGNOSIS — F259 Schizoaffective disorder, unspecified: Secondary | ICD-10-CM | POA: Diagnosis present

## 2019-02-14 DIAGNOSIS — Z20828 Contact with and (suspected) exposure to other viral communicable diseases: Secondary | ICD-10-CM | POA: Diagnosis present

## 2019-02-14 DIAGNOSIS — Z30017 Encounter for initial prescription of implantable subdermal contraceptive: Secondary | ICD-10-CM

## 2019-02-14 DIAGNOSIS — F332 Major depressive disorder, recurrent severe without psychotic features: Secondary | ICD-10-CM | POA: Diagnosis present

## 2019-02-14 DIAGNOSIS — A749 Chlamydial infection, unspecified: Secondary | ICD-10-CM | POA: Diagnosis not present

## 2019-02-14 DIAGNOSIS — O26893 Other specified pregnancy related conditions, third trimester: Secondary | ICD-10-CM | POA: Diagnosis present

## 2019-02-14 DIAGNOSIS — O99344 Other mental disorders complicating childbirth: Secondary | ICD-10-CM | POA: Diagnosis present

## 2019-02-14 DIAGNOSIS — O9872 Human immunodeficiency virus [HIV] disease complicating childbirth: Principal | ICD-10-CM | POA: Diagnosis present

## 2019-02-14 DIAGNOSIS — Z21 Asymptomatic human immunodeficiency virus [HIV] infection status: Secondary | ICD-10-CM | POA: Diagnosis present

## 2019-02-14 LAB — COMPREHENSIVE METABOLIC PANEL
ALT: 12 U/L (ref 0–44)
AST: 28 U/L (ref 15–41)
Albumin: 3.1 g/dL — ABNORMAL LOW (ref 3.5–5.0)
Alkaline Phosphatase: 103 U/L (ref 38–126)
Anion gap: 10 (ref 5–15)
BUN: 12 mg/dL (ref 6–20)
CO2: 18 mmol/L — ABNORMAL LOW (ref 22–32)
Calcium: 9 mg/dL (ref 8.9–10.3)
Chloride: 107 mmol/L (ref 98–111)
Creatinine, Ser: 0.6 mg/dL (ref 0.44–1.00)
GFR calc Af Amer: >60 mL/min (ref >60)
GFR calc non Af Amer: >60 mL/min (ref >60)
Glucose, Bld: 89 mg/dL (ref 70–99)
Potassium: 4 mmol/L (ref 3.5–5.1)
Sodium: 135 mmol/L (ref 135–145)
Total Bilirubin: 1.5 mg/dL — ABNORMAL HIGH (ref 0.3–1.2)
Total Protein: 7.7 g/dL (ref 6.5–8.1)

## 2019-02-14 LAB — CBC
HCT: 36.5 % (ref 36.0–46.0)
Hemoglobin: 12.6 g/dL (ref 12.0–15.0)
MCH: 32.3 pg (ref 26.0–34.0)
MCHC: 34.5 g/dL (ref 30.0–36.0)
MCV: 93.6 fL (ref 80.0–100.0)
Platelets: 183 10*3/uL (ref 150–400)
RBC: 3.9 MIL/uL (ref 3.87–5.11)
RDW: 12.9 % (ref 11.5–15.5)
WBC: 8.2 10*3/uL (ref 4.0–10.5)
nRBC: 0 % (ref 0.0–0.2)

## 2019-02-14 LAB — TYPE AND SCREEN
ABO/RH(D): A POS
Antibody Screen: NEGATIVE

## 2019-02-14 LAB — POCT FERN TEST: POCT Fern Test: POSITIVE

## 2019-02-14 MED ORDER — LACTATED RINGERS IV SOLN: 500.0000 mL | INTRAVENOUS | Status: DC | PRN

## 2019-02-14 MED ORDER — ZIDOVUDINE 10 MG/ML IV SOLN
1.0000 mg/kg/h | INTRAVENOUS | Status: DC
  Administered 2019-02-15 (×3): 1 mg/kg/h via INTRAVENOUS
  Filled 2019-02-14 (×6): qty 40

## 2019-02-14 MED ORDER — LIDOCAINE HCL (PF) 1 % IJ SOLN: 30.0000 mL | INTRAMUSCULAR | Status: DC | PRN

## 2019-02-14 MED ORDER — ONDANSETRON HCL 4 MG/2ML IJ SOLN
4.0000 mg | Freq: Four times a day (QID) | INTRAMUSCULAR | Status: DC | PRN
  Administered 2019-02-15: 03:00:00 4 mg via INTRAVENOUS
  Filled 2019-02-14: qty 2

## 2019-02-14 MED ORDER — OXYCODONE-ACETAMINOPHEN 5-325 MG PO TABS: 2.0000 | ORAL_TABLET | ORAL | Status: DC | PRN

## 2019-02-14 MED ORDER — OXYTOCIN BOLUS FROM INFUSION: 500.0000 mL | Freq: Once | INTRAVENOUS | Status: DC

## 2019-02-14 MED ORDER — ACETAMINOPHEN 325 MG PO TABS: 650.0000 mg | ORAL_TABLET | ORAL | Status: DC | PRN

## 2019-02-14 MED ORDER — EMTRICITAB-RILPIVIR-TENOFOV AF 200-25-25 MG PO TABS
1.0000 | ORAL_TABLET | Freq: Every day | ORAL | Status: DC
  Administered 2019-02-15: 15:00:00 1 via ORAL
  Filled 2019-02-14: qty 1

## 2019-02-14 MED ORDER — OXYTOCIN 40 UNITS IN NORMAL SALINE INFUSION - SIMPLE MED
2.5000 [IU]/h | INTRAVENOUS | Status: DC
  Filled 2019-02-14: qty 1000

## 2019-02-14 MED ORDER — SOD CITRATE-CITRIC ACID 500-334 MG/5ML PO SOLN: 30.0000 mL | ORAL | Status: DC | PRN

## 2019-02-14 MED ORDER — ZIDOVUDINE 10 MG/ML IV SOLN
2.0000 mg/kg | Freq: Once | INTRAVENOUS | Status: AC
  Administered 2019-02-14: 170 mg via INTRAVENOUS
  Filled 2019-02-14: qty 17

## 2019-02-14 MED ORDER — LACTATED RINGERS IV SOLN
INTRAVENOUS | Status: DC
  Administered 2019-02-14 – 2019-02-15 (×2): via INTRAVENOUS

## 2019-02-14 MED ORDER — OXYCODONE-ACETAMINOPHEN 5-325 MG PO TABS: 1.0000 | ORAL_TABLET | ORAL | Status: DC | PRN

## 2019-02-14 NOTE — MAU Note (Signed)
PT SAYS SROM AT 850PM- CLEAR.   FEELS MILD  UC'S.  [place] WITH  CLINIC -  VE  1-2 CM.  DENIES HSV  OR MRSA.  IS POSITIVE HIV- BOYFRIEND DOES  NOT KNOW.Anna Mason
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
## 2019-02-14 NOTE — Telephone Encounter (Signed)
Pt called wanting to know how to tell if her water breaks. She stated that she is 36 weeks. Advised to notify the OB GYN on call and speak with them to see if she needs to get to the hospital now or to wait. She denies having contractions. She voiced understanding.

## 2019-02-15 ENCOUNTER — Inpatient Hospital Stay (HOSPITAL_COMMUNITY): Payer: Medicare Other | Admitting: Anesthesiology

## 2019-02-15 ENCOUNTER — Ambulatory Visit: Payer: Medicare Other | Admitting: Infectious Diseases

## 2019-02-15 ENCOUNTER — Encounter (HOSPITAL_COMMUNITY): Payer: Self-pay | Admitting: *Deleted

## 2019-02-15 DIAGNOSIS — O9872 Human immunodeficiency virus [HIV] disease complicating childbirth: Secondary | ICD-10-CM

## 2019-02-15 DIAGNOSIS — Z3A39 39 weeks gestation of pregnancy: Secondary | ICD-10-CM

## 2019-02-15 LAB — RPR: RPR Ser Ql: NONREACTIVE

## 2019-02-15 LAB — SARS CORONAVIRUS 2 BY RT PCR (HOSPITAL ORDER, PERFORMED IN ~~LOC~~ HOSPITAL LAB): SARS Coronavirus 2: NEGATIVE

## 2019-02-15 LAB — ABO/RH: ABO/RH(D): A POS

## 2019-02-15 MED ORDER — ONDANSETRON HCL 4 MG/2ML IJ SOLN: 4.0000 mg | INTRAMUSCULAR | Status: DC | PRN

## 2019-02-15 MED ORDER — OXYTOCIN 40 UNITS IN NORMAL SALINE INFUSION - SIMPLE MED
1.0000 m[IU]/min | INTRAVENOUS | Status: DC
  Administered 2019-02-15: 06:00:00 2 m[IU]/min via INTRAVENOUS

## 2019-02-15 MED ORDER — FENTANYL CITRATE (PF) 100 MCG/2ML IJ SOLN
100.0000 ug | INTRAMUSCULAR | Status: DC | PRN
  Administered 2019-02-15 (×3): 100 ug via INTRAVENOUS
  Filled 2019-02-15 (×3): qty 2

## 2019-02-15 MED ORDER — DIPHENHYDRAMINE HCL 25 MG PO CAPS: 25.0000 mg | ORAL_CAPSULE | Freq: Four times a day (QID) | ORAL | Status: DC | PRN

## 2019-02-15 MED ORDER — IBUPROFEN 600 MG PO TABS
600.0000 mg | ORAL_TABLET | Freq: Four times a day (QID) | ORAL | Status: DC
  Administered 2019-02-15 – 2019-02-17 (×5): 600 mg via ORAL
  Filled 2019-02-15 (×7): qty 1

## 2019-02-15 MED ORDER — ACETAMINOPHEN 325 MG PO TABS: 650.0000 mg | ORAL_TABLET | ORAL | Status: DC | PRN

## 2019-02-15 MED ORDER — DIBUCAINE (PERIANAL) 1 % EX OINT: 1.0000 "application " | TOPICAL_OINTMENT | CUTANEOUS | Status: DC | PRN

## 2019-02-15 MED ORDER — BENZOCAINE-MENTHOL 20-0.5 % EX AERO
1.0000 "application " | INHALATION_SPRAY | CUTANEOUS | Status: DC | PRN
  Administered 2019-02-15: 1 via TOPICAL
  Filled 2019-02-15: qty 56

## 2019-02-15 MED ORDER — WITCH HAZEL-GLYCERIN EX PADS: 1.0000 "application " | MEDICATED_PAD | CUTANEOUS | Status: DC | PRN

## 2019-02-15 MED ORDER — PHENYLEPHRINE 40 MCG/ML (10ML) SYRINGE FOR IV PUSH (FOR BLOOD PRESSURE SUPPORT)
80.0000 ug | PREFILLED_SYRINGE | INTRAVENOUS | Status: DC | PRN
  Filled 2019-02-15: qty 10

## 2019-02-15 MED ORDER — FENTANYL-BUPIVACAINE-NACL 0.5-0.125-0.9 MG/250ML-% EP SOLN: 12.0000 mL/h | EPIDURAL | Status: DC | PRN

## 2019-02-15 MED ORDER — EPHEDRINE 5 MG/ML INJ: 10.0000 mg | INTRAVENOUS | Status: DC | PRN

## 2019-02-15 MED ORDER — TERBUTALINE SULFATE 1 MG/ML IJ SOLN: 0.2500 mg | Freq: Once | INTRAMUSCULAR | Status: DC | PRN

## 2019-02-15 MED ORDER — COCONUT OIL OIL: 1.0000 "application " | TOPICAL_OIL | Status: DC | PRN

## 2019-02-15 MED ORDER — TETANUS-DIPHTH-ACELL PERTUSSIS 5-2.5-18.5 LF-MCG/0.5 IM SUSP: 0.5000 mL | Freq: Once | INTRAMUSCULAR | Status: DC

## 2019-02-15 MED ORDER — DIPHENHYDRAMINE HCL 50 MG/ML IJ SOLN: 12.5000 mg | INTRAMUSCULAR | Status: DC | PRN

## 2019-02-15 MED ORDER — LIDOCAINE HCL (PF) 1 % IJ SOLN
INTRAMUSCULAR | Status: DC | PRN
  Administered 2019-02-15 (×2): 5 mL via EPIDURAL

## 2019-02-15 MED ORDER — SENNOSIDES-DOCUSATE SODIUM 8.6-50 MG PO TABS
2.0000 | ORAL_TABLET | ORAL | Status: DC
  Administered 2019-02-15: 23:00:00 2 via ORAL
  Filled 2019-02-15 (×2): qty 2

## 2019-02-15 MED ORDER — SIMETHICONE 80 MG PO CHEW: 80.0000 mg | CHEWABLE_TABLET | ORAL | Status: DC | PRN

## 2019-02-15 MED ORDER — ONDANSETRON HCL 4 MG PO TABS: 4.0000 mg | ORAL_TABLET | ORAL | Status: DC | PRN

## 2019-02-15 MED ORDER — SODIUM CHLORIDE (PF) 0.9 % IJ SOLN
INTRAMUSCULAR | Status: DC | PRN
  Administered 2019-02-15: 12 mL/h via EPIDURAL

## 2019-02-15 MED ORDER — FENTANYL-BUPIVACAINE-NACL 0.5-0.125-0.9 MG/250ML-% EP SOLN
12.0000 mL/h | EPIDURAL | Status: DC | PRN
  Filled 2019-02-15: qty 250

## 2019-02-15 MED ORDER — LACTATED RINGERS IV SOLN: 500.0000 mL | Freq: Once | INTRAVENOUS | Status: DC

## 2019-02-15 MED ORDER — PHENYLEPHRINE 40 MCG/ML (10ML) SYRINGE FOR IV PUSH (FOR BLOOD PRESSURE SUPPORT): 80.0000 ug | PREFILLED_SYRINGE | INTRAVENOUS | Status: DC | PRN

## 2019-02-15 MED ORDER — PRENATAL MULTIVITAMIN CH
1.0000 | ORAL_TABLET | Freq: Every day | ORAL | Status: DC
  Administered 2019-02-16 – 2019-02-17 (×2): 1 via ORAL
  Filled 2019-02-15: qty 1

## 2019-02-15 NOTE — Discharge Summary (Addendum)
Postpartum Discharge Summary   Patient Name: Anna Mason DOB: [birth year] MRN: [id number]  Date of admission: 02/14/2019 Delivering Provider: Merilyn Baba   Date of discharge: 02/17/2019  Admitting diagnosis: Water Broke  Intrauterine pregnancy: [redacted]w[redacted]d    Secondary diagnosis:  Active Problems:   MDD (major depressive disorder), recurrent episode, severe ([place]   Supervision of high risk pregnancy, antepartum   Chlamydia   HIV positive ([place]  Additional problems: Schizoaffective disorder     Discharge diagnosis: Term Pregnancy Delivered                                                                                                Post partum procedures:Nexplanon placement PPD#1  Augmentation: Pitocin  Complications: None  Hospital course:  Onset of Labor With Vaginal Delivery     23y.o. yo G1P0 at 318w6das admitted in Latent Labor with SROM on 02/14/2019. She was augmented with Pitocin and subsequently progressed to completely dilated.  Membrane Rupture Time/Date: 8:50 PM ,02/15/2019   Intrapartum Procedures: Episiotomy: None [1]                                         Lacerations:  Labial [10]  Patient had a delivery of a Viable infant. 02/15/2019  Information for the patient's newborn:  SmRin, G[place]0[[id number]]Delivery Method: Vaginal, Spontaneous(Filed from Delivery Summary)     Pateint had an uncomplicated postpartum course. She had been dx and tx with chlamydia in October- TOC on admission was negative.  ID was consulted during her stay with orders to continue preadmit ARV regimen post delivery. She also requested a Nexplanon placement on PPD#1. She is ambulating, tolerating a regular diet, passing flatus, and urinating well. Patient is discharged home in stable condition on 02/17/19.  Delivery time: 1:09 PM    Magnesium Sulfate received: No BMZ received: No Rhophylac:N/A MMR:N/A Transfusion:No  Physical exam  Vitals:   02/16/19 0430 02/16/19 1402  02/16/19 2145 02/17/19 0545  BP: 124/69 114/69 118/74 139/77  Pulse: (!) 55 60 (!) 53 65  Resp: '18 18 16 18  '$ Temp: 98.5 F (36.9 C) 98.4 F (36.9 C) 97.8 F (36.6 C) (!) 97.5 F (36.4 C)  TempSrc:  Oral Oral Oral  SpO2: 100%  100% 100%  Weight:      Height:       General: alert, cooperative and no distress Lochia: appropriate Uterine Fundus: firm Incision: N/A DVT Evaluation: No evidence of DVT seen on physical exam. Labs: Lab Results  Component Value Date   WBC 8.2 02/14/2019   HGB 12.6 02/14/2019   HCT 36.5 02/14/2019   MCV 93.6 02/14/2019   PLT 183 02/14/2019   CMP Latest Ref Rng & Units 02/14/2019  Glucose 70 - 99 mg/dL 89  BUN 6 - 20 mg/dL 12  Creatinine 0.44 - 1.00 mg/dL 0.60  Sodium 135 - 145 mmol/L 135  Potassium 3.5 - 5.1 mmol/L 4.0  Chloride 98 - 111 mmol/L 107  CO2 22 -  32 mmol/L 18(L)  Calcium 8.9 - 10.3 mg/dL 9.0  Total Protein 6.5 - 8.1 g/dL 7.7  Total Bilirubin 0.3 - 1.2 mg/dL 1.5(H)  Alkaline Phos 38 - 126 U/L 103  AST 15 - 41 U/L 28  ALT 0 - 44 U/L 12    Discharge instruction: per After Visit Summary and "Baby and Me Booklet".  After visit meds:  Allergies as of 02/17/2019      Reactions   Pineapple Swelling      Medication List    STOP taking these medications   aspirin EC 81 MG tablet   Blood Pressure Monitor Kit   doxylamine (Sleep) 25 MG tablet Commonly known as: UNISOM   pyridOXINE 25 MG tablet Commonly known as: VITAMIN B-6     TAKE these medications   acetaminophen 325 MG tablet Commonly known as: Tylenol Take 2 tablets (650 mg total) by mouth every 4 (four) hours as needed (for pain scale < 4).   benzocaine-Menthol 20-0.5 % Aero Commonly known as: DERMOPLAST Apply 1 application topically as needed for irritation (perineal discomfort).   emtricitabine-rilpivir-tenofovir AF 200-25-25 MG Tabs tablet Commonly known as: ODEFSEY Take 1 tablet by mouth daily with breakfast.   ibuprofen 600 MG tablet Commonly known as:  ADVIL Take 1 tablet (600 mg total) by mouth every 6 (six) hours.   PrePLUS 27-1 MG Tabs Take 1 tablet by mouth daily.       Diet: routine diet  Activity: Advance as tolerated. Pelvic rest for 6 weeks.   Outpatient follow up:4 weeks Follow up Appt: Future Appointments  Date Time Provider [place]  03/15/2019 10:15 AM Laury Deep, CNM [place] None   Follow up Visit: Please schedule this patient for Postpartum visit in: 4 weeks with the following provider: Any provider For C/S patients schedule nurse incision check in weeks 2 weeks: no High risk pregnancy complicated by: HIV+, H/o Chlamydia treated 10/5 in office Delivery mode:  SVD Anticipated Birth Control:  Nexplanon PP Procedures needed: None  Schedule Integrated [place] visit: yes  Newborn Data: Live born female  Birth Weight:  3235gm (7lb 2.1oz) APGAR: 65, 9  Newborn Delivery   Birth date/time: 02/15/2019 13:09:00 Delivery type: Vaginal, Spontaneous      Baby Feeding: Bottle Disposition:home with mother   02/17/2019 Gifford Shave, MD   CNM attestation I have seen and examined this patient and agree with above documentation in the resident's note.   Anna Mason is a 23 y.o. G1P1001 s/p vag del.   Pain is well controlled.  Plan for birth control is Nexplanon- placed prior to discharge.  Method of Feeding: bottle  PE:  BP 139/77 (BP Location: Right Arm)   Pulse 65   Temp (!) 97.5 F (36.4 C) (Oral)   Resp 18   Ht '5\' 7"'$  (1.702 m)   Wt 85.1 kg   LMP 05/12/2018 (Exact Date)   SpO2 100%   Breastfeeding Unknown   BMI 29.38 kg/m  Fundus firm  Recent Labs    02/14/19 2244  HGB 12.6  HCT 36.5     Plan: discharge today - postpartum care discussed - f/u clinic in 4 weeks for postpartum visit; pt to schedule ID f/u    Myrtis Ser, CNM 10:16 AM  02/17/2019

## 2019-02-15 NOTE — Progress Notes (Signed)
      INFECTIOUS DISEASE ATTENDING ADDENDUM:   Date: 02/15/2019  Patient name: Anna Mason  Medical record number: [id number]  Date of birth: 1995/05/24   I was called re the ARV regimen patient should be on postpartum  She has been on [place] with VL <20 as recently as October 21st.  I think this is fine for her to continue postpartum with food and avoiding PPI, H2 blockers  While this regimen does not have a high barrier to resistance if she is tolerating it well and highly adherent to it and adhering to requirement of a chewable meal and avoidance of antacids it is reasonable to continue  She was viremic as recently as April of 2020 though not high grade viremia  I would have concerns re the father of her child having been sexually active with her prior to her becoming adherent to her ARV this year.  I DO think however that these discussions are more appropriate to occur in the outpatient world with her provider Dr. Prince Rome.   If the team would like me to I am certainly happy to come and see her and discuss need to disclose her HIV+ status to her boyfriend and for him to be tested for HIV.  Please call me again tomorrow if you would like me to do so.  From looking at Dr Jules Schick notes he was already emphasizing need to disclose to her boyfriend.   Alcide Evener 02/15/2019, 7:35 PM

## 2019-02-15 NOTE — Discharge Instructions (Signed)

## 2019-02-15 NOTE — Progress Notes (Signed)
LABOR PROGRESS NOTE  Anna Mason is a 23 y.o. female G1P0 with IUP at [redacted]w[redacted]d by LMP presenting for SROM at 2050. Pregnancy complicated by HIV on ART with undetectable load 10/21, chlamydia tx without TOC 10/6, and schizoaffective disorder.   Subjective: Endorses increasing contractions with pain controlled with IV medications.   Objective: BP 127/67   Pulse (!) 51   Temp 98.4 F (36.9 C) (Oral)   Resp 16   Ht 5\' 7"  (1.702 m)   Wt 85.1 kg   LMP 05/12/2018 (Exact Date)   BMI 29.38 kg/m  or  Vitals:   02/15/19 0300 02/15/19 0400 02/15/19 0555 02/15/19 0600  BP: (!) 110/51 120/72  127/67  Pulse: (!) 51 66  (!) 51  Resp: 16 16 16    Temp:  98.7 F (37.1 C) 98.4 F (36.9 C)   TempSrc:  Oral Oral   Weight:      Height:        Dilation: 3.5 Effacement (%): 90 Station: -1 Presentation: Vertex Exam by:: Dr. Dione Plover FHT: baseline 140, moderate variability, + accelerations, - decelerations.  Uterine activityToco: q 3 minutes   Labs: Lab Results  Component Value Date   WBC 8.2 02/14/2019   HGB 12.6 02/14/2019   HCT 36.5 02/14/2019   MCV 93.6 02/14/2019   PLT 183 02/14/2019    Patient Active Problem List   Diagnosis Date Noted  . Non compliance with medical treatment 02/02/2019  . HIV positive ([place]) 07/30/2018  . Chlamydia 07/29/2018  . Supervision of high risk pregnancy, antepartum 07/28/2018  . MDD (major depressive disorder), recurrent episode, severe ([place]) 10/25/2013  . Anxiety state, unspecified 10/25/2013    Assessment / Plan: Anna Mason is a 23 y.o. female G1P0 with IUP at [redacted]w[redacted]d by LMP presenting for SROM at 2050. Pregnancy complicated by HIV on ART with undetectable load 10/21, chlamydia without TOC 10/6, and schizoaffective disorder.   #Labor: Presenting in SROM @ 2250. Was 3.5 cm on this exam Starting pitocin  #Pain: IV Fentanyl and epidural upon request   #FWB: Cat I, EFW 2019 gm 86%. Placenta posterior, [place]: 7 lbs, POCUS: OP vertex #ID: GBS  negative and HIV positive on ART.   #HIV-Diagnosed in 2015. Last seen by infectious disease on 10/21. Taking [place] ART therapy. 10/7 viral load undetectable < 20 copies. AZT and continue home ART therapy. #Chlamydia - Diagnosed 4/20 and treated 10/5 w/o TOC. Labs pending for [facility] #Schizoaffective Disorder: Not currently on any medication. No symptoms at time of presentation.      Maxie Better, PGY-1, MD OBGYN Faculty Teaching Service  02/15/2019, 6:30 AM

## 2019-02-15 NOTE — Anesthesia Preprocedure Evaluation (Addendum)
Anesthesia Evaluation  Patient identified by MRN, date of birth, ID band Patient awake    Reviewed: Allergy & Precautions, NPO status , Patient's Chart, lab work & pertinent test results  History of Anesthesia Complications Negative for: history of anesthetic complications  Airway Mallampati: II  TM Distance: >3 FB Neck ROM: Full    Dental no notable dental hx. (+) Dental Advisory Given   Pulmonary neg pulmonary ROS,    Pulmonary exam normal        Cardiovascular negative cardio ROS Normal cardiovascular exam     Neuro/Psych PSYCHIATRIC DISORDERS Anxiety Depression Schizophrenia negative neurological ROS     GI/Hepatic negative GI ROS, Neg liver ROS,   Endo/Other  negative endocrine ROS  Renal/GU negative Renal ROS  negative genitourinary   Musculoskeletal negative musculoskeletal ROS (+)   Abdominal   Peds negative pediatric ROS (+)  Hematology negative hematology ROS (+)   Anesthesia Other Findings   Reproductive/Obstetrics negative OB ROS                            Anesthesia Physical Anesthesia Plan  ASA: III  Anesthesia Plan: Epidural   Post-op Pain Management:    Induction:   PONV Risk Score and Plan:   Airway Management Planned: Natural Airway  Additional Equipment:   Intra-op Plan:   Post-operative Plan:   Informed Consent: I have reviewed the patients History and Physical, chart, labs and discussed the procedure including the risks, benefits and alternatives for the proposed anesthesia with the patient or authorized representative who has indicated his/her understanding and acceptance.     Dental advisory given  Plan Discussed with: Anesthesiologist  Anesthesia Plan Comments:        Anesthesia Quick Evaluation

## 2019-02-15 NOTE — Anesthesia Procedure Notes (Signed)
Epidural Patient location during procedure: OB Start time: 02/15/2019 7:51 AM End time: 02/15/2019 8:09 AM  Staffing Anesthesiologist: Duane Boston, MD Performed: anesthesiologist   Preanesthetic Checklist Completed: patient identified, site marked, pre-op evaluation, timeout performed, IV checked, risks and benefits discussed and monitors and equipment checked  Epidural Patient position: sitting Prep: DuraPrep Patient monitoring: heart rate, cardiac monitor, continuous pulse ox and blood pressure Approach: midline Location: L2-L3 Injection technique: LOR saline  Needle:  Needle type: Tuohy  Needle gauge: 17 G Needle length: 9 cm Needle insertion depth: 7 cm Catheter size: 20 Guage Catheter at skin depth: 12 cm Test dose: negative and Other  Assessment Events: blood not aspirated, injection not painful, no injection resistance and negative IV test  Additional Notes Informed consent obtained prior to proceeding including risk of failure, 1% risk of PDPH, risk of minor discomfort and bruising.  Discussed rare but serious complications including epidural abscess, permanent nerve injury, epidural hematoma.  Discussed alternatives to epidural analgesia and patient desires to proceed.  Timeout performed pre-procedure verifying patient name, procedure, and platelet count.  Patient tolerated procedure well.

## 2019-02-15 NOTE — Progress Notes (Signed)
Anna Mason is a 23 y.o. G1P0 at [redacted]w[redacted]d admitted forSROM at 32. Pregnancy complicated by HIV on ART with undetectable load 10/21, chlamydiatx 10/05 (has not had TOC yet), and schizoaffective disorder.   Subjective: Comfortable with epidural. Feeling baby move.  Objective: BP 120/70   Pulse 63   Temp 98.6 F (37 C) (Oral)   Resp 18   Ht 5\' 7"  (1.702 m)   Wt 85.1 kg   LMP 05/12/2018 (Exact Date)   BMI 29.38 kg/m  No intake/output data recorded.  FHT:  FHR: 135 bpm, variability: moderate,  accelerations:  Present,  decelerations:  Absent UC:   regular, every 3-4 minutes  SVE:   Dilation: 4 Effacement (%): 90 Station: 0 Exam by:: Cecelia Byars RN  Pitocin @ 6 mu/min  Labs: Lab Results  Component Value Date   WBC 8.2 02/14/2019   HGB 12.6 02/14/2019   HCT 36.5 02/14/2019   MCV 93.6 02/14/2019   PLT 183 02/14/2019    Assessment / Plan: Anna Smithis a 23 y.o.femaleG1P0 with IUP at [redacted]w[redacted]d by LMPpresenting for SROM at 2050. Pregnancy complicated by HIV on ART with undetectable load 10/21, chlamydia without TOC 10/5, and schizoaffective disorder.  #Labor:Presenting in SROM @ 2250. Progressing well on pitocin #Pain:Epidural #FWB:Cat I, EFW 2019 gm 86%.  #ID:GBS negative and HIV positive on ART.  #HIV-Diagnosed in 2015. Last seen by infectious disease on 10/21. Taking [place] ART therapy. 10/7 viral load undetectable < 20 copies. AZTand continue home ART therapy. #Chlamydia - Diagnosed 4/20 and treated 10/5 w/o TOC. Labs pending for [facility] #Schizoaffective Disorder: Not currently on any medication. No symptoms at time of presentation.  Merilyn Baba DO OB Fellow, Faculty Practice 02/15/2019, 9:27 AM

## 2019-02-16 LAB — GC/CHLAMYDIA PROBE AMP (~~LOC~~) NOT AT ARMC
Chlamydia: NEGATIVE
Comment: NEGATIVE
Comment: NORMAL
Neisseria Gonorrhea: NEGATIVE

## 2019-02-16 MED ORDER — LIDOCAINE HCL 1 % IJ SOLN
0.0000 mL | Freq: Once | INTRAMUSCULAR | Status: AC | PRN
  Administered 2019-02-16: 11:00:00 20 mL via INTRADERMAL
  Filled 2019-02-16 (×2): qty 20

## 2019-02-16 MED ORDER — EMTRICITAB-RILPIVIR-TENOFOV AF 200-25-25 MG PO TABS
1.0000 | ORAL_TABLET | Freq: Every day | ORAL | Status: DC
  Administered 2019-02-16 – 2019-02-17 (×2): 1 via ORAL
  Filled 2019-02-16 (×2): qty 1

## 2019-02-16 MED ORDER — ETONOGESTREL 68 MG ~~LOC~~ IMPL
68.0000 mg | DRUG_IMPLANT | Freq: Once | SUBCUTANEOUS | Status: AC
  Administered 2019-02-16: 11:00:00 68 mg via SUBCUTANEOUS
  Filled 2019-02-16: qty 1

## 2019-02-16 NOTE — Clinical Social Work Maternal (Signed)
CLINICAL SOCIAL WORK MATERNAL/CHILD NOTE  Patient Details  Name: Charlott Holler MRN: [id number] Date of Birth: 03/16/1996  Date:  2018/10/22  Clinical Social Worker Initiating Note:  Elijio Miles Date/Time: Initiated:  02/16/19/1040     Child's Name:  Claudean Kinds   Biological Parents:  Mother, Father(Zilphia Tamala Julian and Estill Bamberg)   Need for Interpreter:  None   Reason for Referral:  Behavioral Health Concerns, Current Substance Use/Substance Use During Pregnancy    Address:  [place] [address]    Phone number:  [phone]    Additional phone number:   Household Members/Support Persons (HM/SP):   Household Member/Support Person 1   HM/SP Name Relationship DOB or Age  HM/SP -[place] FOB 64  HM/SP -2        HM/SP -3        HM/SP -4        HM/SP -5        HM/SP -6        HM/SP -7        HM/SP -8          Natural Supports (not living in the home):  Parent(MGM)   Professional Supports:     Employment: Unemployed   Type of Work:     Education:  9 to 11 years   Homebound arranged:    Pensions consultant:  Information systems manager , Medicaid   Other Resources:  Physicist, medical , [place] Considerations Which May Impact Care:    Strengths:  Ability to meet basic needs , Home prepared for child    Psychotropic Medications:         Pediatrician:       Pediatrician List:   [place]      Pediatrician Fax Number:    Risk Factors/Current Problems:  Substance Use , Mental Health Concerns , Family/Relationship Issues    Cognitive State:  Able to Concentrate , Alert , Linear Thinking    Mood/Affect:  Calm , Comfortable , Interested , Happy    CSW Assessment:  CSW received consult for THC use, schizoaffective disorder and HIV diagnosis.  CSW met with MOB to offer support and complete assessment.    MOB and FOB sitting on  the couch with infant asleep in bassinet, when CSW entered the room. CSW introduced self and with verbal permission from MOB, asked FOB to step out of the room so CSW could meet with MOB in private. FOB understanding and left voluntarily. CSW explained reason for consult to which MOB expressed understanding. MOB very pleasant and engaged throughout assessment and was appropriate and attentive to infant. MOB reported that she and FOB currently live together in [facility]. MOB confirmed she receives both [facility] and food stamps and was provided with phone numbers for both [facility] and DSS to update her plans. CSW inquired about MOB's mental health history and MOB acknowledged being diagnosed with bipolar schizophrenia at the age of 23. MOB stated she has a follow up appointment with [facility] after she is discharged to get restarted on medications. Per MOB, she has been on medications in the past that have made her drowsy and she has discussed these concerns with [facility] and they have medications they can start her on that won't make her drowsy. MOB also stated she MOB denied any recent or current symptoms  associated with her diagnosis. CSW provided education regarding the baby blues period vs. perinatal mood disorders, discussed treatment and gave resources for mental health follow up if concerns arise. CSW recommended self-evaluation during the postpartum time period using the New Mom Checklist from Postpartum Progress and encouraged MOB to contact a medical professional if symptoms are noted at any time. MOB did not appear to be displaying any acute mental health symptoms and reported feeling happy. MOB denied any current SI, HI or DV and shared she has good support from FOB and her mother who lives out of state but is able to visit often. CSW inquired about MOB's interest in being referred for [facility] and [facility] to receive additional support after discharge. MOB receptive and appreciative of this.   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 MOB stated she was prescribed medications for this but that they were not effective. MOB reported last use was the night before delivery. CSW informed MOB of Hospital Drug Policy and explained UDS and CDS were still pending but that a CPS report would be made, if warranted. CSW detailed what process may look like if CPS is involved. MOB denied any further questions or concerns regarding policy or potential report.  CSW aware MOB has HIV diagnosis and spoke with MOB regarding follow up care for infant. CSW offered 3 closest pediatric clinics for MOB to choose from and MOB chose the Pediatric Infectious Diseases Clinic with [place]. CSW spoke with Tonia Ghent with [facility] regarding appointment for infant and Tonia Ghent stated infant able to be seen on November 13 at [facility] provided MOB with letter detailing appointment time and information. CSW inquired about when MOB was diagnosed with HIV and MOB shared she was diagnosed when she was 23 years old. CSW inquired about how MOB has processed her diagnosis and MOB stated she has been handling it ok but that she's just worried infant could have it. MOB shared with CSW that she is compliant with taking her medications and gets them mailed to her home. CSW inquired about if FOB was aware of her diagnosis and MOB shared that he is not but that she plans to inform him. CSW stressed the importance of MOB informing FOB of her diagnosis so that FOB could get tested and MOB expressed understanding. CSW spoke with [facility] Department Representative to see if report needs to be made due to MOB's diagnosis and FOB not having knowledge. [facility] Representative faxing form for CSW to fill out and fax back.   MOB confirmed having all essential items for infant once discharged and stated infant would be sleeping in a bassinet once home. CSW provided  review of Sudden Infant Death Syndrome (SIDS) precautions and safe sleeping habits.     CSW Plan/Description:  No Further Intervention Required/No Barriers to Discharge, Sudden Infant Death Syndrome (SIDS) Education, Perinatal Mood and Anxiety Disorder (PMADs) Education, [place], Other Information/Referral to [employer], CSW Will Continue to Monitor Umbilical Cord Tissue Drug Screen Results and Make Report if Alcus Dad [place], [place] 03-Jun-2018, 12:52 PM

## 2019-02-16 NOTE — Anesthesia Postprocedure Evaluation (Signed)
Anesthesia Post Note  Patient: Anna Mason  Procedure(s) Performed: AN AD [place]     Patient location during evaluation: Mother Baby Anesthesia Type: Epidural Level of consciousness: awake Pain management: satisfactory to patient Vital Signs Assessment: post-procedure vital signs reviewed and stable Respiratory status: spontaneous breathing Cardiovascular status: stable Anesthetic complications: no    Last Vitals:  Vitals:   02/16/19 0030 02/16/19 0430  BP: (!) 103/53 124/69  Pulse: 70 (!) 55  Resp: 18 18  Temp: 37.2 C 36.9 C  SpO2: 100% 100%    Last Pain:  Vitals:   02/16/19 0430  TempSrc:   PainSc: 0-No pain   Pain Goal: Patients Stated Pain Goal: 5 (02/15/19 0539)                 Casimer Lanius

## 2019-02-16 NOTE — Progress Notes (Signed)
Post Partum Day 1 Subjective: no complaints, up ad lib, voiding, tolerating PO and + flatus  Objective: Blood pressure 124/69, pulse (!) 55, temperature 98.5 F (36.9 C), resp. rate 18, height 5\' 7"  (1.702 m), weight 85.1 kg, last menstrual period 05/12/2018, SpO2 100 %, unknown if currently breastfeeding.  Physical Exam:  General: alert and no distress Lochia: appropriate Uterine Fundus: firm DVT Evaluation: No evidence of DVT seen on physical exam. No cords or calf tenderness. No significant calf/ankle edema.  Recent Labs    02/14/19 2244  HGB 12.6  HCT 36.5    Assessment/Plan: Plan for discharge tomorrow. Contraception Nexplanon. Bottle feeding. ID consulted for HIV management and recommended patient resume [place].   LOS: 2 days   Dorothyann Peng 02/16/2019, 7:55 AM

## 2019-02-16 NOTE — Progress Notes (Addendum)
Patient ID: Anna Mason, female   DOB: February 11, 1996, 23 y.o.   MRN: [id number] [place] PROCEDURE NOTE   Anna Mason is a 23 y.o. G1P1001 requesting Nexplanon insertion. She is PPD#1 after a NSVD. No complaints.  BP 124/69 (BP Location: Left Arm)   Pulse (!) 55   Temp 98.5 F (36.9 C)   Resp 18   Ht 5\' 7"  (1.702 m)   Wt 85.1 kg   LMP 05/12/2018 (Exact Date)   SpO2 100%   Bottlefeeding  BMI 29.38 kg/m     Nexplanon Insertion Procedure Patient identified, informed consent performed, consent signed.   Patient does understand that irregular bleeding is a very common side effect of this medication. She was advised to have backup contraception for one week after placement. Pregnancy test in clinic today was negative.  Appropriate time out taken.  Patient's left arm was prepped and draped in the usual sterile fashion. The ruler used to measure and mark insertion area.  Patient was prepped with alcohol swab and then injected with 2 ml of 1% lidocaine.  She was prepped with betadine, Nexplanon removed from packaging,  Device confirmed in needle, then inserted full length of needle and withdrawn per handbook instructions. Nexplanon was able to palpated in the patient's arm; patient palpated the insert herself. There was minimal blood loss.  Patient insertion site covered with guaze and a pressure bandage to reduce any bruising.  The patient tolerated the procedure well and was given post procedure instructions. Follow-up via My Chart video visit, unless having problems and need to be seen in the office.  Nexplanon Lot#: [id number] / Expiration Date: 03/16/2021  Laury Deep, CNM  02/16/2019 11:30 AM

## 2019-02-17 MED ORDER — BENZOCAINE-MENTHOL 20-0.5 % EX AERO: 1.0000 "application " | INHALATION_SPRAY | CUTANEOUS | 0 refills | Status: DC | PRN

## 2019-02-17 MED ORDER — IBUPROFEN 600 MG PO TABS: 600.0000 mg | ORAL_TABLET | Freq: Four times a day (QID) | ORAL | 0 refills | Status: DC

## 2019-02-17 MED ORDER — ACETAMINOPHEN 325 MG PO TABS: 650.0000 mg | ORAL_TABLET | ORAL | Status: DC | PRN

## 2019-02-17 NOTE — Care Management Important Message (Signed)
Important Message  Patient Details  Name: Madalena Kesecker MRN: [id number] Date of Birth: 12-Mar-1996   Medicare Important Message Given:  Yes     Kelce, Bouton 02/17/2019, 8:01 AM

## 2019-02-23 MED FILL — ODEFSEY 200-25-25 MG TABS: 200-25-25 | 30 days supply | Qty: 30 | Fill #4

## 2019-03-15 ENCOUNTER — Telehealth (INDEPENDENT_AMBULATORY_CARE_PROVIDER_SITE_OTHER): Payer: Medicare Other | Admitting: Obstetrics and Gynecology

## 2019-03-15 ENCOUNTER — Encounter: Payer: Self-pay | Admitting: Obstetrics and Gynecology

## 2019-03-15 ENCOUNTER — Other Ambulatory Visit: Payer: Self-pay

## 2019-03-15 DIAGNOSIS — Z1389 Encounter for screening for other disorder: Secondary | ICD-10-CM | POA: Diagnosis not present

## 2019-03-15 NOTE — Progress Notes (Signed)
I connected with [name] on 03/15/19 at 10:15 AM EST by: My Chart video visit and verified that I am speaking with the correct person using two identifiers.  Patient is located at home and provider is located at [place].     The purpose of this virtual visit is to provide medical care while limiting exposure to the novel coronavirus. I discussed the limitations, risks, security and privacy concerns of performing an evaluation and management service and the availability of in person appointments. I also discussed with the patient that there may be a patient responsible charge related to this service. By engaging in this virtual visit, you consent to the provision of healthcare.  Additionally, you authorize for your insurance to be billed for the services provided during this visit.  The patient expressed understanding and agreed to proceed.  The following staff members participated in the virtual visit: Hollice Gong, [place] Partum Visit Note Subjective:    [name] is a 23 y.o. G26P1001 female who presents for a postpartum visit. She is 4 weeks postpartum following a spontaneous vaginal delivery. I have fully reviewed the prenatal and intrapartum course. The delivery was at [redacted]w[redacted]d gestational weeks. Outcome: spontaneous vaginal delivery. Anesthesia: epidural. Postpartum course has been unremarkable. Baby's course has been unremarkable. Baby's name: Georgina Snell. Baby is feeding by bottle - [place]. Bleeding no bleeding. Bowel function is normal. Bladder function is normal. Patient is not sexually active. Contraception method Nexplanon. Postpartum depression screening: negative.  The following portions of the patient's history were reviewed and updated as appropriate: allergies, current medications, past family history, past medical history, past social history, past surgical history and problem list.  Review of Systems Constitutional: negative Eyes: negative Ears, nose,  mouth, throat, and face: negative Respiratory: negative Cardiovascular: negative Gastrointestinal: negative Genitourinary:negative Integument/breast: negative Hematologic/lymphatic: negative Musculoskeletal:negative Neurological: negative Behavioral/Psych: negative Endocrine: negative Allergic/Immunologic: negative   Objective:  There were no vitals filed for this visit. Self-Obtained       Assessment:    Normal postpartum exam. Pap smear not done at today's visit. Last pap smear 07/31/2018 and results were negative. Next pap due 07/2021.   Plan:   1. Contraception: Nexplanon placed in-patient 2. Follow up in: 1 year for well-woman exam or as needed.   10 minutes of non-face-to-face time spent with the patient. There was 5 minutes of chart review time spent prior to this encounter. Total time spent = 15 minutes.  Laury Deep, CNM  03/15/2019

## 2019-04-05 ENCOUNTER — Ambulatory Visit: Payer: Medicare Other | Admitting: Infectious Diseases

## 2019-04-05 MED FILL — ODEFSEY 200-25-25 MG TABS: 200-25-25 | 30 days supply | Qty: 30 | Fill #5

## 2019-04-12 ENCOUNTER — Other Ambulatory Visit: Payer: Self-pay

## 2019-04-12 ENCOUNTER — Ambulatory Visit (INDEPENDENT_AMBULATORY_CARE_PROVIDER_SITE_OTHER): Payer: Medicare Other | Admitting: Family

## 2019-04-12 ENCOUNTER — Encounter: Payer: Self-pay | Admitting: Family

## 2019-04-12 VITALS — BP 133/73 | HR 76 | Temp 97.0°F | Wt 166.0 lb

## 2019-04-12 DIAGNOSIS — B20 Human immunodeficiency virus [HIV] disease: Secondary | ICD-10-CM

## 2019-04-12 DIAGNOSIS — Z0000 Encounter for general adult medical examination without abnormal findings: Secondary | ICD-10-CM | POA: Insufficient documentation

## 2019-04-12 MED ORDER — EMTRICITAB-RILPIVIR-TENOFOV AF 200-25-25 MG PO TABS: 1.0000 | ORAL_TABLET | Freq: Every day | ORAL | 3 refills | Status: DC

## 2019-04-12 NOTE — Assessment & Plan Note (Signed)
Anna Mason continues to have good adherence and tolerance to her ART regimen Odefsey.  No signs/symptoms of opportunistic infection or progressive HIV disease.  We reviewed previous lab work and discussed the plan of care.  Check blood work today.  Continue current dose of Odefsey.  Plan for follow-up in 3 months or sooner if needed with lab work on the same day or 1 to 2 weeks prior to the appointment.

## 2019-04-12 NOTE — Patient Instructions (Signed)
Nice to meet you.  Please continue to take your [facility] as prescribed daily.  We will check your blood work today.  Refills of medication of been sent to the pharmacy.  Plan for follow-up in 3 months or sooner if needed with lab work on the same day for 1 to 2 weeks prior to your appointment.  Have a great day and stay safe!  Happy new year!

## 2019-04-12 NOTE — Assessment & Plan Note (Signed)
   Declines Menveo vaccination today.  Working on Insurance underwriter through [facility]  Discussed importance of safe sexual practice to reduce risk of STI.  Condoms provided.
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
## 2019-04-13 LAB — T-HELPER CELL (CD4) - (RCID CLINIC ONLY)
CD4 % Helper T Cell: 37 % (ref 33–65)
CD4 T Cell Abs: 668 /uL (ref 400–1790)

## 2019-04-17 LAB — COMPLETE METABOLIC PANEL WITH GFR
AG Ratio: 1.1 (calc) (ref 1.0–2.5)
ALT: 12 U/L (ref 6–29)
AST: 13 U/L (ref 10–30)
Albumin: 4.2 g/dL (ref 3.6–5.1)
Alkaline phosphatase (APISO): 50 U/L (ref 31–125)
BUN: 14 mg/dL (ref 7–25)
CO2: 24 mmol/L (ref 20–32)
Calcium: 9.3 mg/dL (ref 8.6–10.2)
Chloride: 110 mmol/L (ref 98–110)
Creat: 0.85 mg/dL (ref 0.50–1.10)
GFR, Est African American: 113 mL/min/{1.73_m2} (ref >=60)
GFR, Est Non African American: 97 mL/min/{1.73_m2} (ref >=60)
Globulin: 4 g/dL (calc) — ABNORMAL HIGH (ref 1.9–3.7)
Glucose, Bld: 77 mg/dL (ref 65–99)
Potassium: 4 mmol/L (ref 3.5–5.3)
Sodium: 139 mmol/L (ref 135–146)
Total Bilirubin: 0.9 mg/dL (ref 0.2–1.2)
Total Protein: 8.2 g/dL — ABNORMAL HIGH (ref 6.1–8.1)

## 2019-04-17 LAB — HIV-1 RNA QUANT-NO REFLEX-BLD
HIV 1 RNA Quant: <20 copies/mL
HIV-1 RNA Quant, Log: <1.3 Log copies/mL

## 2019-04-19 ENCOUNTER — Telehealth: Payer: Self-pay | Admitting: *Deleted

## 2019-04-19 NOTE — Telephone Encounter (Signed)
Patient has relocated to [facility] and will not be returning.

## 2019-06-21 ENCOUNTER — Other Ambulatory Visit: Payer: Medicare Other

## 2019-07-06 ENCOUNTER — Encounter: Payer: Medicare Other | Admitting: Family

## 2020-10-11 ENCOUNTER — Telehealth: Payer: Self-pay

## 2020-10-11 NOTE — Telephone Encounter (Signed)
Left patient voice mail to call back to schedule an appointment with [facility] after a request from patient needing to schedule, has been out of medication.

## 2020-10-25 ENCOUNTER — Ambulatory Visit: Payer: Medicare Other | Admitting: Family

## 2020-12-24 ENCOUNTER — Encounter: Payer: Self-pay | Admitting: Family

## 2020-12-24 ENCOUNTER — Other Ambulatory Visit: Payer: Self-pay

## 2020-12-24 ENCOUNTER — Other Ambulatory Visit (HOSPITAL_COMMUNITY)
Admission: RE | Admit: 2020-12-24 | Discharge: 2020-12-24 | Disposition: A | Discharge status: Home / Self Care | Payer: Medicare HMO | Source: Ambulatory Visit | Attending: Family | Admitting: Family

## 2020-12-24 ENCOUNTER — Ambulatory Visit (INDEPENDENT_AMBULATORY_CARE_PROVIDER_SITE_OTHER): Payer: Medicare Other | Admitting: Family

## 2020-12-24 VITALS — BP 127/81 | HR 72 | Temp 98.2°F | Ht 67.0 in | Wt 188.0 lb

## 2020-12-24 DIAGNOSIS — Z23 Encounter for immunization: Secondary | ICD-10-CM | POA: Diagnosis present

## 2020-12-24 DIAGNOSIS — Z113 Encounter for screening for infections with a predominantly sexual mode of transmission: Secondary | ICD-10-CM | POA: Insufficient documentation

## 2020-12-24 DIAGNOSIS — Z0000 Encounter for general adult medical examination without abnormal findings: Secondary | ICD-10-CM | POA: Diagnosis not present

## 2020-12-24 DIAGNOSIS — B20 Human immunodeficiency virus [HIV] disease: Secondary | ICD-10-CM

## 2020-12-24 MED ORDER — EMTRICITAB-RILPIVIR-TENOFOV AF 200-25-25 MG PO TABS: 1.0000 | ORAL_TABLET | Freq: Every day | ORAL | 3 refills | Status: DC

## 2020-12-24 NOTE — Assessment & Plan Note (Signed)
Anna Mason returns to [facility] with poorly controlled virus having been off medication for 3 months after returning to [facility] from [place]. No signs/symptoms of opportunistic infection. She has Medicaid and should have no problems obtaining medication from the pharmacy. Check lab work today including genotype. Plan for follow up in 1 month or sooner if needed.
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
## 2020-12-24 NOTE — Patient Instructions (Signed)
Nice to see you.  We will check your lab work today.  Restart taking your medication.  Refills have been sent to the pharmacy.  Plan for follow up in 1 month or sooner if needed with lab work on the same day.  Have a great day and stay safe!

## 2020-12-24 NOTE — Assessment & Plan Note (Signed)
   Discussed importance of safe sexual practice and condom usage. Condoms provided.  Menveo updated.

## 2020-12-25 LAB — URINE CYTOLOGY ANCILLARY ONLY
Chlamydia: NEGATIVE
Comment: NEGATIVE
Comment: NORMAL
Neisseria Gonorrhea: NEGATIVE

## 2020-12-25 LAB — T-HELPER CELL (CD4) - (RCID CLINIC ONLY)
CD4 % Helper T Cell: 17 % — ABNORMAL LOW (ref 33–65)
CD4 T Cell Abs: 359 /uL — ABNORMAL LOW (ref 400–1790)

## 2020-12-31 ENCOUNTER — Other Ambulatory Visit (HOSPITAL_COMMUNITY): Payer: Self-pay

## 2020-12-31 NOTE — Telephone Encounter (Signed)
Rockelle called [facility] to follow up. She has not been able to fill her Charlett Lango once since she moved back to  in April.   RN reached out to Lupita Leash who was able to find out that the patient has incomplete insurance coverage. She needs to contact her DSS caseworker to get her prescription coverage (part D) in place. Once she has part D, she will be able to fill her prescription. Trenise has her DSS caseworker's contact information and will call this morning to get this fixed. RN also gave her instructions on how to engage with [facility] for case management as well. Andree Coss, RN

## 2021-01-02 ENCOUNTER — Other Ambulatory Visit (HOSPITAL_COMMUNITY): Payer: Self-pay

## 2021-01-02 NOTE — Telephone Encounter (Signed)
Patient notified office that she still isn't able to obtain her Odefsey. RN contacted [facility] on [address] (who has the prescription) and was told that they have a notification that shows "[facility] store not enrolled; scan card ID". RN spoke with Lupita Leash who requested patient bring her Medicare part D card to the office for [place] to run through system and determine coverage. Patient was given information that would be on the part D card and she stated she provided this information to the pharmacy however [facility] needs the card, per RN's understanding.   Patient to contact her case manager with DSS and follow up on card delivery and schedule an appointment with our pharmacy team to figure out next steps for medication.   Thales Knipple Loyola Mast, RN

## 2021-01-04 LAB — HIV-1 GENOTYPE: HIV-1 Genotype: DETECTED — AB

## 2021-01-04 LAB — HIV-1 INTEGRASE GENOTYPE

## 2021-01-04 LAB — HIV RNA, RTPCR W/R GT (RTI, PI,INT)
HIV 1 RNA Quant: 38000 copies/mL — ABNORMAL HIGH
HIV-1 RNA Quant, Log: 4.58 Log copies/mL — ABNORMAL HIGH

## 2021-01-04 LAB — COMPREHENSIVE METABOLIC PANEL
AG Ratio: 0.7 (calc) — ABNORMAL LOW (ref 1.0–2.5)
ALT: 42 U/L — ABNORMAL HIGH (ref 6–29)
AST: 27 U/L (ref 10–30)
Albumin: 4.1 g/dL (ref 3.6–5.1)
Alkaline phosphatase (APISO): 61 U/L (ref 31–125)
BUN: 15 mg/dL (ref 7–25)
CO2: 25 mmol/L (ref 20–32)
Calcium: 9.6 mg/dL (ref 8.6–10.2)
Chloride: 106 mmol/L (ref 98–110)
Creat: 0.7 mg/dL (ref 0.50–0.96)
Globulin: 5.6 g/dL (calc) — ABNORMAL HIGH (ref 1.9–3.7)
Glucose, Bld: 88 mg/dL (ref 65–99)
Potassium: 4.2 mmol/L (ref 3.5–5.3)
Sodium: 136 mmol/L (ref 135–146)
Total Bilirubin: 0.6 mg/dL (ref 0.2–1.2)
Total Protein: 9.7 g/dL — ABNORMAL HIGH (ref 6.1–8.1)

## 2021-01-04 LAB — RPR: RPR Ser Ql: NONREACTIVE

## 2021-01-23 ENCOUNTER — Ambulatory Visit: Payer: Medicaid Other | Admitting: Family

## 2021-02-20 ENCOUNTER — Telehealth: Payer: Self-pay

## 2021-02-20 NOTE — Telephone Encounter (Signed)
Called patient to get an appointment scheduled, left voicemail for patient to call us back to get scheduled.

## 2021-02-21 ENCOUNTER — Telehealth: Payer: Self-pay

## 2021-02-21 NOTE — Telephone Encounter (Signed)
Called patient to get appointments scheduled, left voicemail for patient to call us and get scheduled.

## 2021-03-11 ENCOUNTER — Ambulatory Visit: Payer: Medicaid Other | Admitting: Family

## 2021-03-14 ENCOUNTER — Ambulatory Visit (INDEPENDENT_AMBULATORY_CARE_PROVIDER_SITE_OTHER): Payer: Medicare Other | Admitting: Family

## 2021-03-14 ENCOUNTER — Other Ambulatory Visit (HOSPITAL_COMMUNITY): Payer: Self-pay

## 2021-03-14 ENCOUNTER — Other Ambulatory Visit: Payer: Self-pay

## 2021-03-14 ENCOUNTER — Encounter: Payer: Self-pay | Admitting: Family

## 2021-03-14 VITALS — BP 131/73 | HR 76 | Temp 98.6°F | Wt 188.0 lb

## 2021-03-14 DIAGNOSIS — Z23 Encounter for immunization: Secondary | ICD-10-CM

## 2021-03-14 DIAGNOSIS — Z0000 Encounter for general adult medical examination without abnormal findings: Secondary | ICD-10-CM

## 2021-03-14 DIAGNOSIS — B20 Human immunodeficiency virus [HIV] disease: Secondary | ICD-10-CM

## 2021-03-14 MED ORDER — EMTRICITAB-RILPIVIR-TENOFOV AF 200-25-25 MG PO TABS: 1.0000 | ORAL_TABLET | Freq: Every day | ORAL | 3 refills | Status: DC

## 2021-03-14 NOTE — Assessment & Plan Note (Signed)
   Menveo and influenza vaccines updated.  Discussed importance of safe sexual practice and condom use.  Condoms offered.

## 2021-03-14 NOTE — Assessment & Plan Note (Signed)
Ms. Morga continues to have poorly controlled virus secondary to less than optimal adherence to her ART regimen of Odefsey as she has been out of medication since October secondary to issues with the pharmacy.  Fortunately no signs/symptoms of opportunistic infection.  Reviewed the importance of letting the clinic know if assistance is needed with the medication acquisition.  Sample of Biktarvy provided and recorded in pharmacy log.  Pharmacy contact information provided should she continue to have issues.  Checked with insurance and her medication should be covered.  Plan for follow-up and blood work in 1 month or sooner if needed.
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
## 2021-03-14 NOTE — Patient Instructions (Signed)
Nice to see you.  Continue to take your medication daily as prescribed.  Refills have been sent to the pharmacy.  Plan for follow up in 1 month or sooner if needed with lab work on the same day.  Have a great day and stay safe!

## 2021-03-15 ENCOUNTER — Other Ambulatory Visit: Payer: Self-pay | Admitting: Pharmacist

## 2021-03-15 DIAGNOSIS — B20 Human immunodeficiency virus [HIV] disease: Secondary | ICD-10-CM

## 2021-03-15 IMAGING — US US MFM OB FOLLOW UP
1 series · 14 of 28 positions shown · non-contrast
Comparison: none

[Series 1: us mfm ob follow up · 91 acquisitions, 14 frames shown]
[im 4/91]
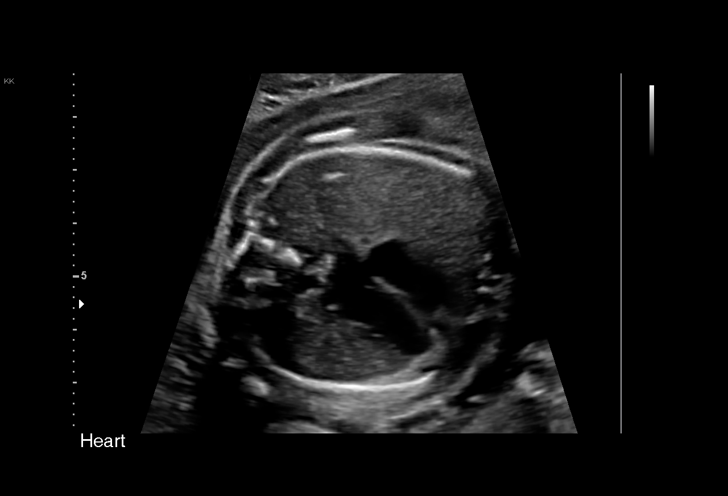
[im 11/91]
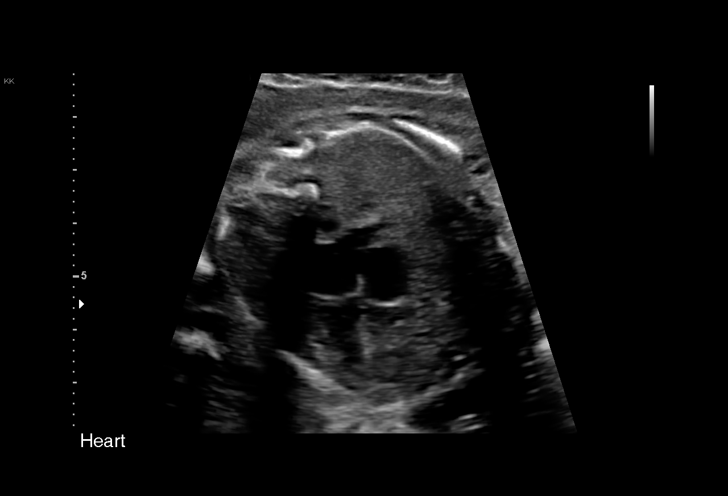
[im 17/91]
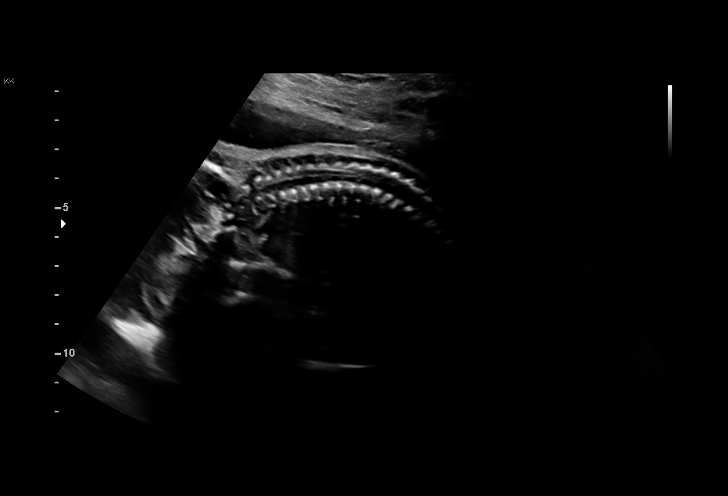
[im 24/91]
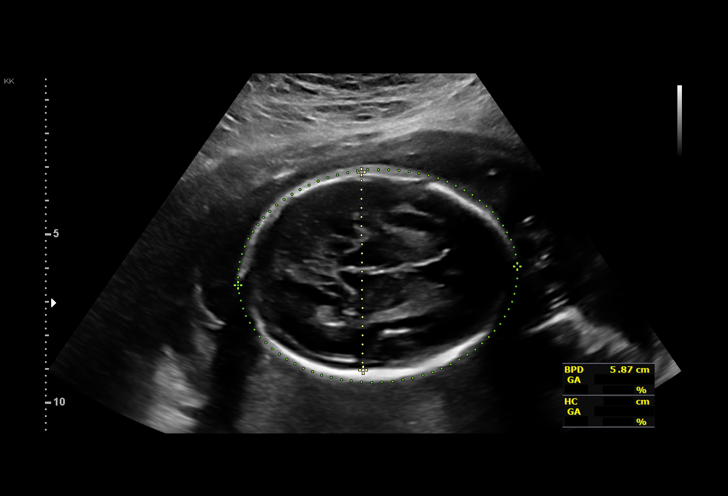
[im 31/91]
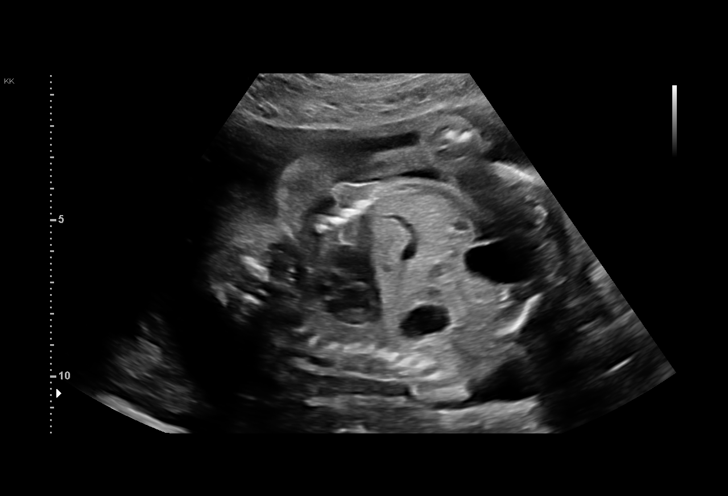
[im 37/91]
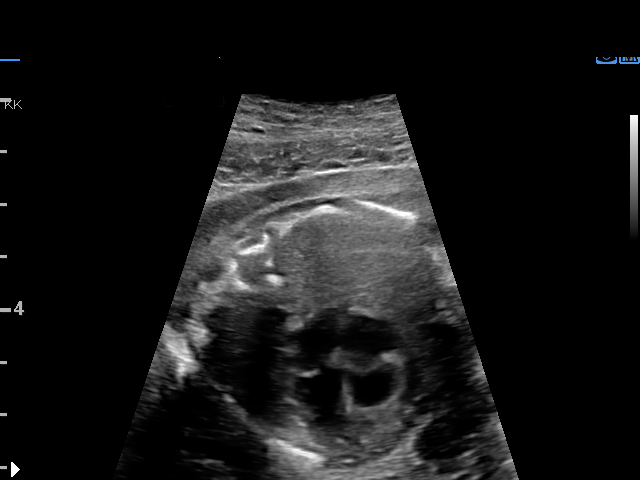
[im 44/91]
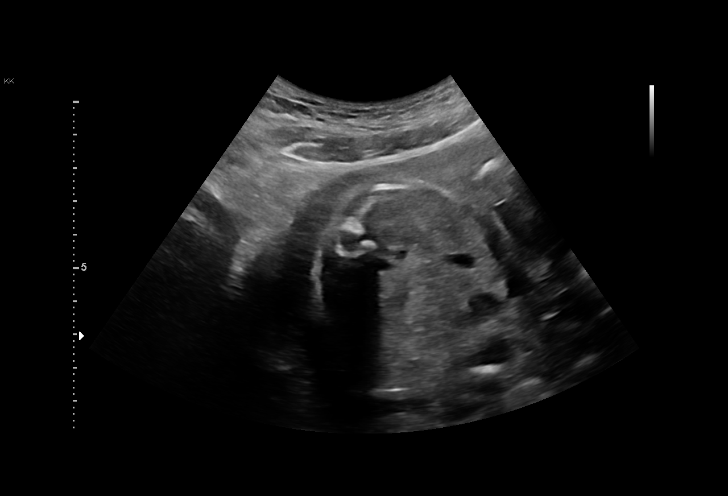
[im 51/91]
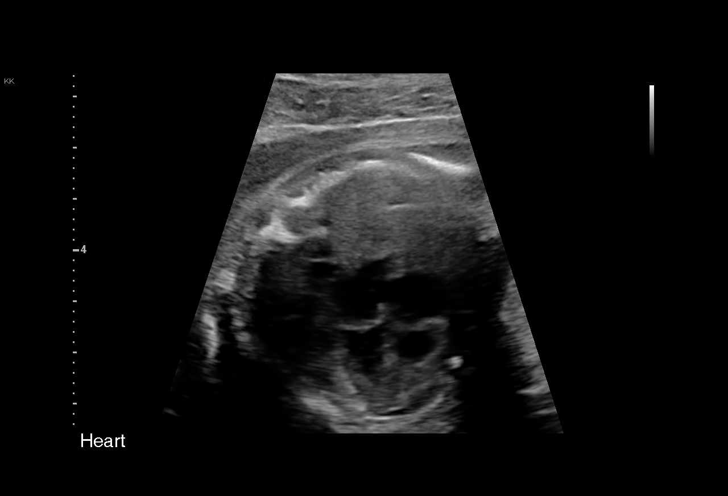
[im 57/91]
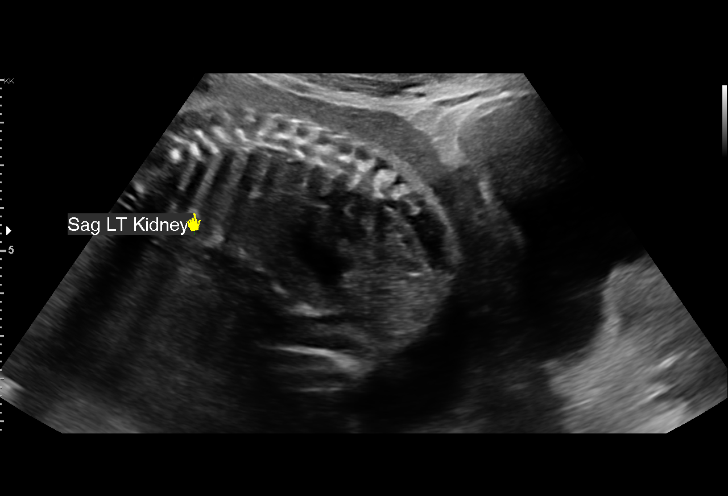
[im 64/91]
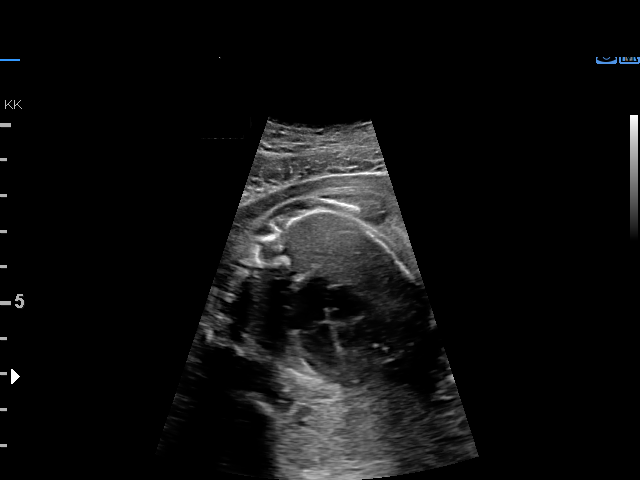
[im 71/91]
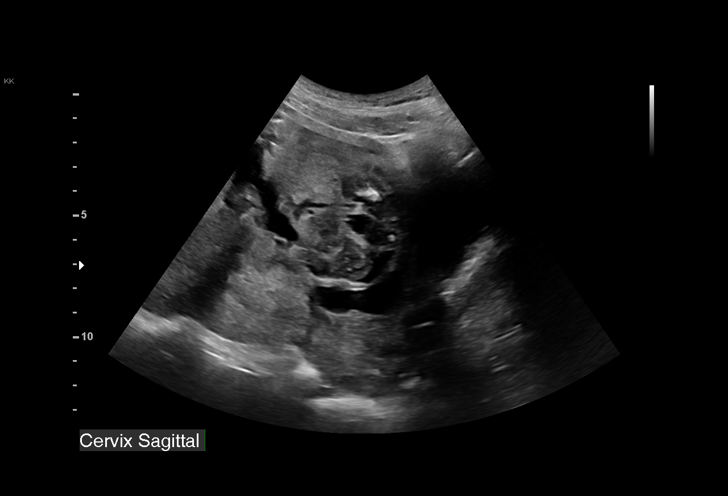
[im 77/91]
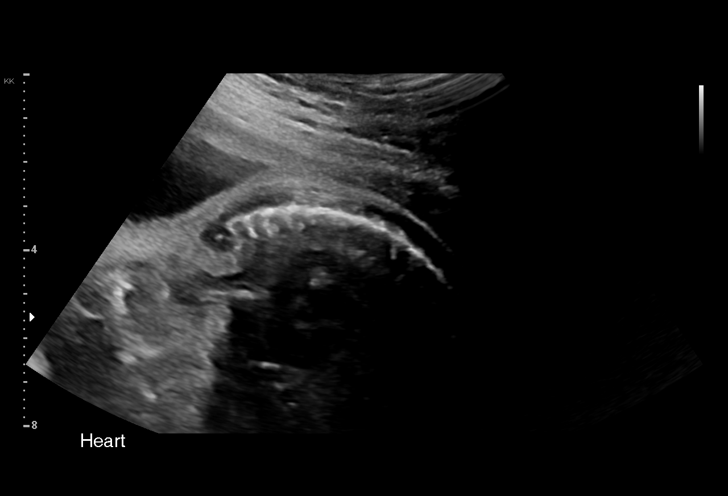
[im 84/91]
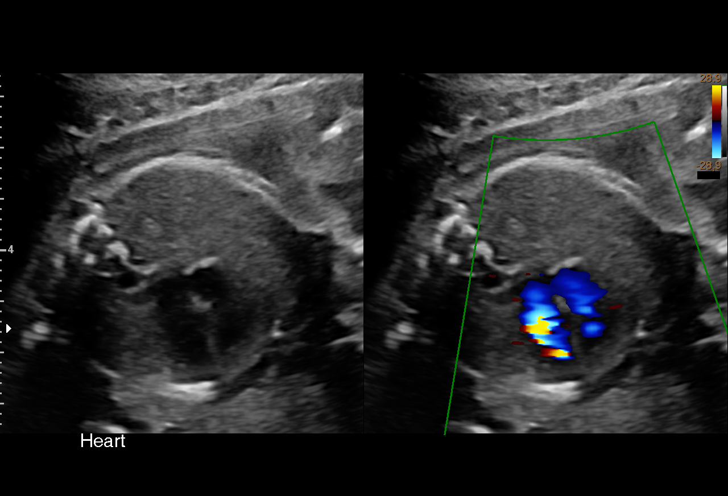
[im 91/91]
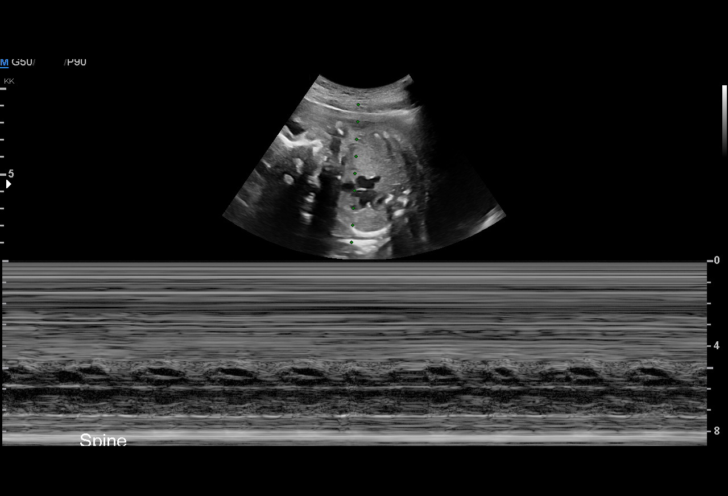

[14 of 28 positions shown; findings below may reference images not displayed]

----------------------------------------------------------------------

 ----------------------------------------------------------------------
Indications

  23 weeks gestation of pregnancy
  HIV affecting pregnancy, second trimester
  Antenatal follow-up for nonvisualized fetal
  anatomy
 ----------------------------------------------------------------------
Fetal Evaluation

 Num Of Fetuses:         1
 Fetal Heart Rate(bpm):  153
 Cardiac Activity:       Observed
 Presentation:           Breech
 Placenta:               Posterior
 P. Cord Insertion:      Previously Visualized

 Amniotic Fluid
 AFI FV:      Within normal limits

                             Largest Pocket(cm)

Biometry

 BPD:      59.2  mm     G. Age:  24w 1d         85  %    CI:        68.57   %    70 - 86
                                                         FL/HC:      19.7   %    19.2 -
 HC:      228.5  mm     G. Age:  24w 6d         94  %    HC/AC:      1.12        1.05 -
 AC:      204.6  mm     G. Age:  25w 0d         93  %    FL/BPD:     76.0   %    71 - 87
 FL:         45  mm     G. Age:  24w 6d         90  %    FL/AC:      22.0   %    20 - 24

 Est. FW:     750  gm    1 lb 10 oz    > 99  %
OB History
 Gravidity:    1         Term:   0        Prem:   0        SAB:   0
 TOP:          0       Ectopic:  0        Living: 0
Gestational Age

 LMP:           23w 0d        Date:  05/12/18                 EDD:   02/16/19
 U/S Today:     24w 5d                                        EDD:   02/04/19
 Best:          23w 0d     Det. By:  LMP  (05/12/18)          EDD:   02/16/19
Anatomy

 Cranium:               Appears normal         Aortic Arch:            Appears normal
 Cavum:                 Appears normal         Ductal Arch:            Appears normal
 Ventricles:            Appears normal         Diaphragm:              Appears normal
 Choroid Plexus:        Previously seen        Stomach:                Appears normal, left
                                                                       sided
 Cerebellum:            Appears normal         Abdomen:                Appears normal
 Posterior Fossa:       Previously seen        Abdominal Wall:         Previously seen
 Nuchal Fold:           Previously seen        Cord Vessels:           Previously seen
 Face:                  Orbits and profile     Kidneys:                Appear normal
                        previously seen
 Lips:                  Appears normal         Bladder:                Appears normal
 Thoracic:              Appears normal         Spine:                  Limited views
                                                                       appear normal
 Heart:                 Not well visualized    Upper Extremities:      Previously seen
 RVOT:                  Appears normal         Lower Extremities:      Previously seen
 LVOT:                  Appears normal

 Other:  Technically difficult due to fetal position. Feet previously visualized.
Cervix Uterus Adnexa

 Cervix
 Length:            3.4  cm.
 Normal appearance by transabdominal scan.
Impression

 Normal interval growth.
 Premature atrial contractions noted every 15-25 beats.
 Suboptimal views of the fetal anatomy were obtained
 secondary to fetal position.
Recommendations

 Follow up growth in 4 weeks.

## 2021-03-15 MED ORDER — BICTEGRAVIR-EMTRICITAB-TENOFOV 50-200-25 MG PO TABS: 1.0000 | ORAL_TABLET | Freq: Every day | ORAL | 0 refills | Status: AC

## 2021-03-15 NOTE — Progress Notes (Signed)
Medication Samples have been provided to the patient.  Drug name: Biktarvy        Strength: 50/200/25 mg       Qty: 21 tablets (3 bottles) LOT: [id number]   Exp.Date: 10/24  Dosing instructions: Take one tablet by mouth once daily  The patient has been instructed regarding the correct time, dose, and frequency of taking this medication, including desired effects and most common side effects.   Margarite Gouge, PharmD, CPP Clinical Pharmacist Practitioner Infectious Diseases Clinical Pharmacist [facility] for Infectious Disease

## 2021-04-12 IMAGING — US US MFM OB FOLLOW UP
1 series · 14 of 28 positions shown · non-contrast
Comparison: none

[Series 1: us mfm ob follow up · 71 acquisitions, 14 frames shown]
[im 3/71]
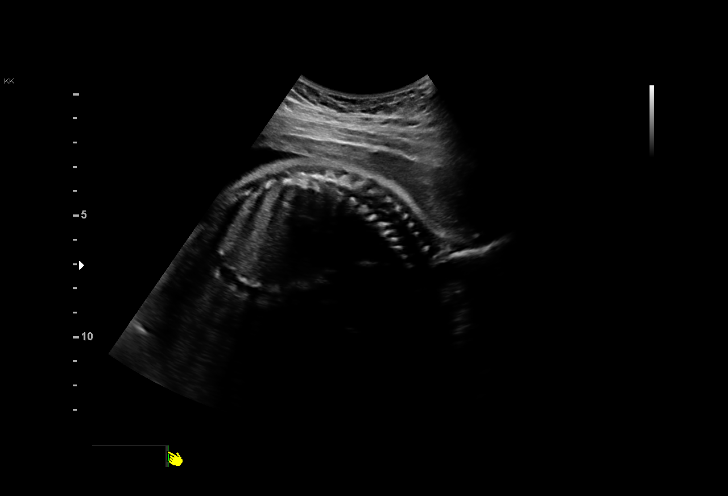
[im 8/71]
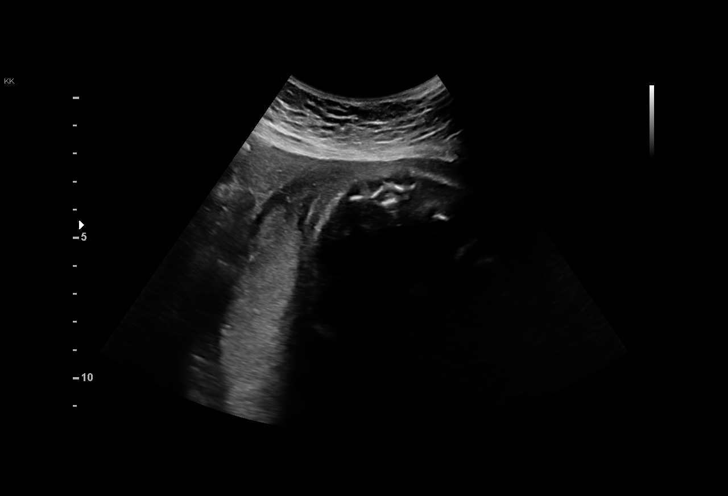
[im 13/71]
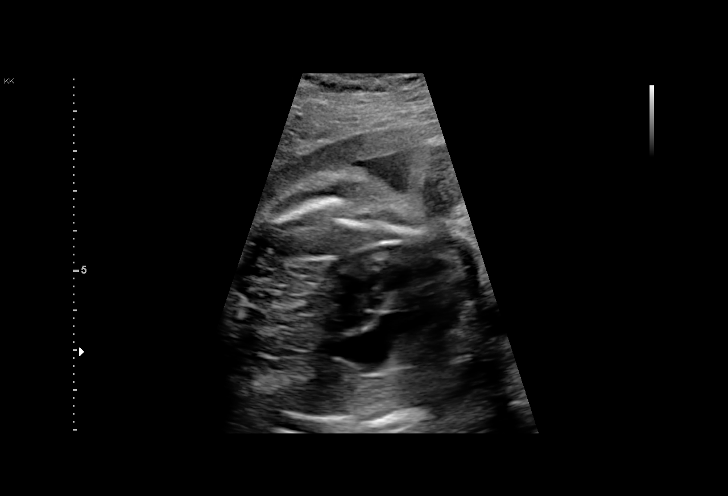
[im 19/71]
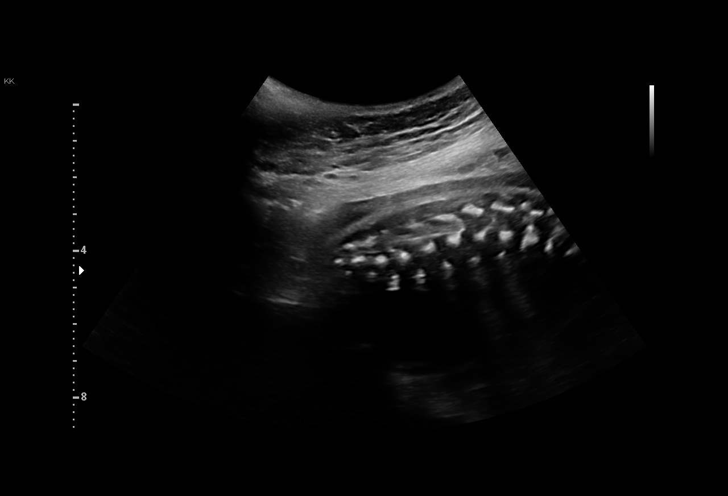
[im 24/71]
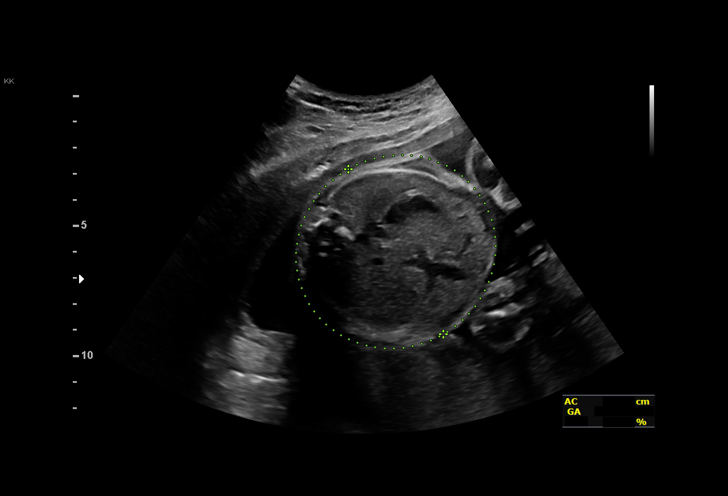
[im 29/71]
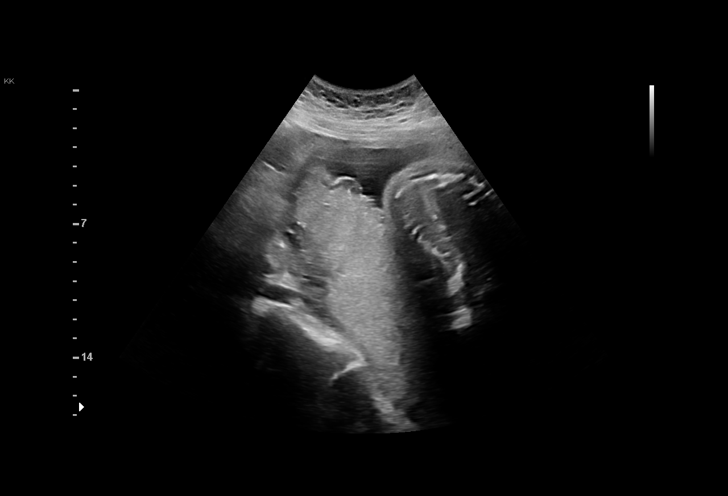
[im 34/71]
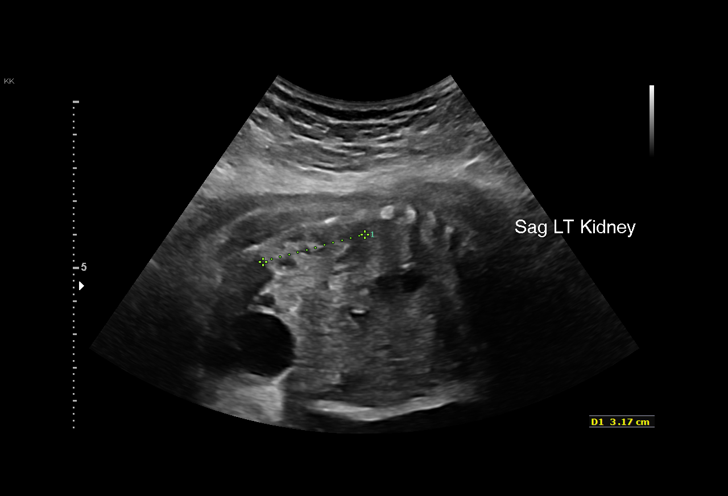
[im 39/71]
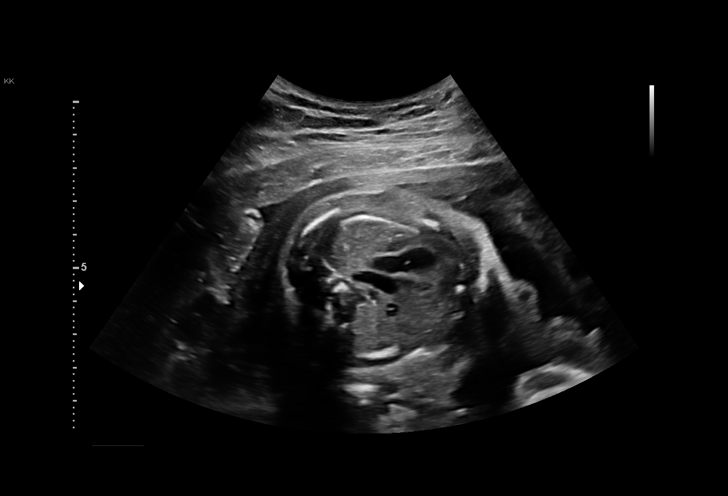
[im 45/71]
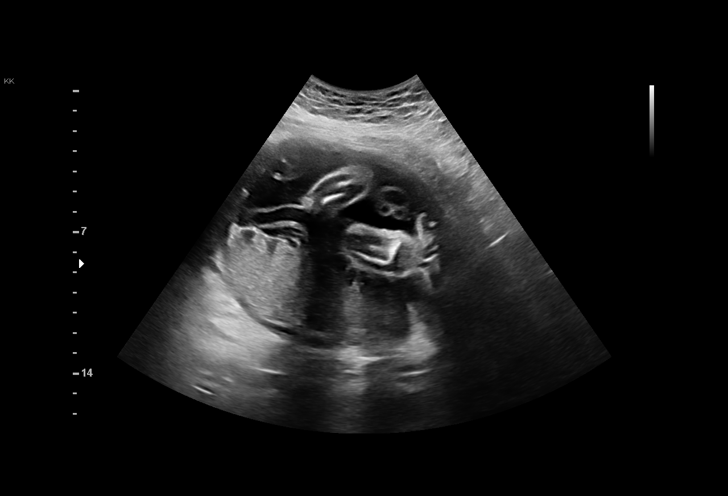
[im 50/71]
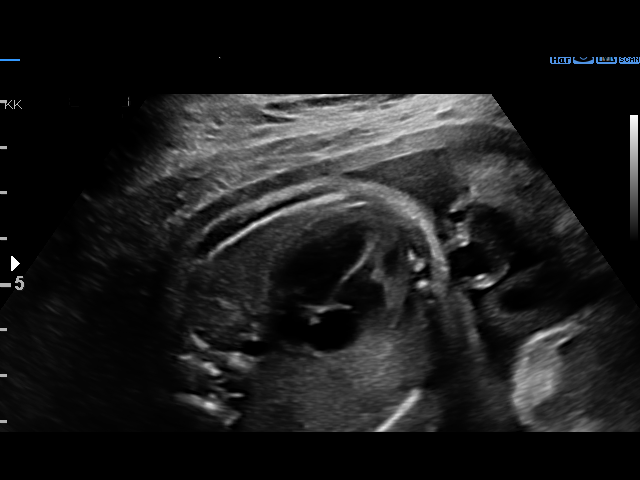
[im 55/71]
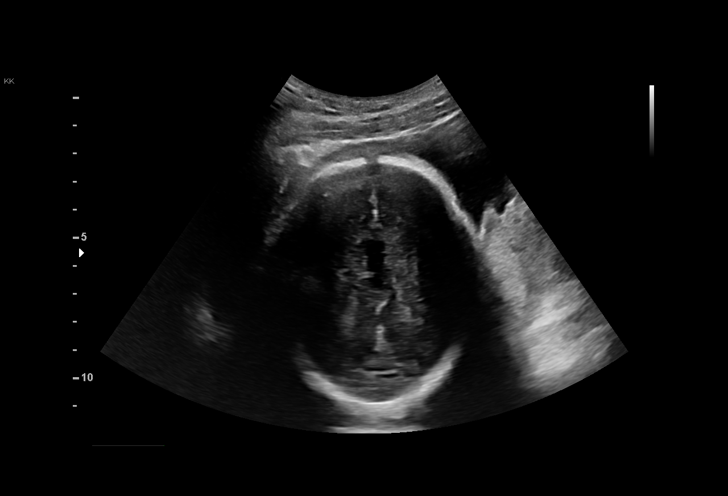
[im 60/71]
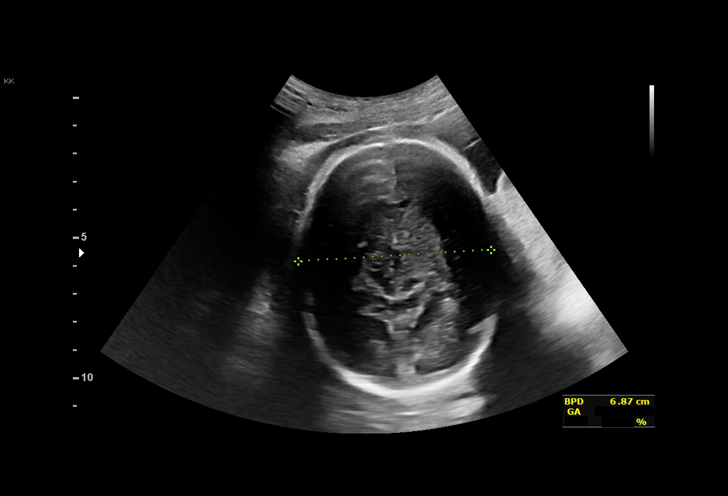
[im 65/71]
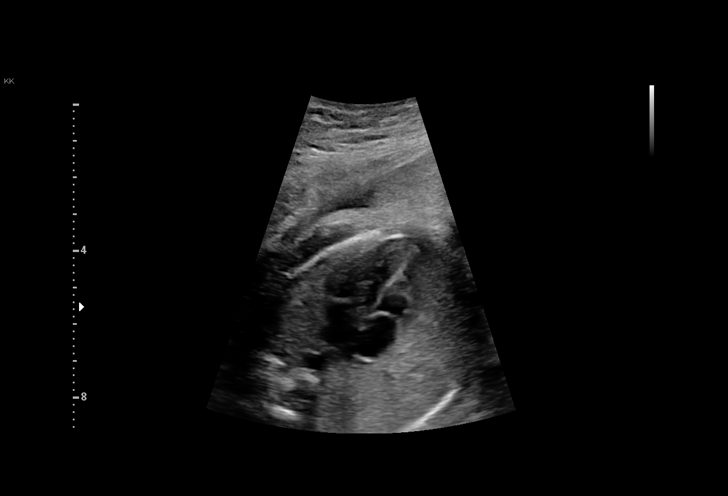
[im 71/71]
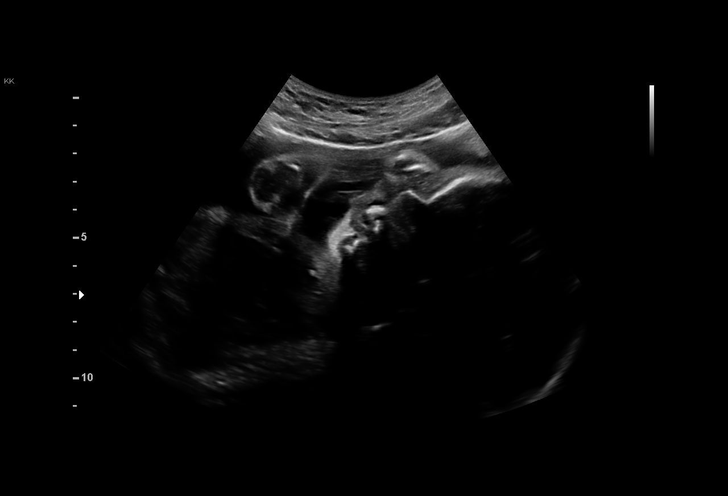

[14 of 28 positions shown; findings below may reference images not displayed]

EMMANUEL PARFAIT
 ----------------------------------------------------------------------

 ----------------------------------------------------------------------
Indications

  27 weeks gestation of pregnancy
  Encounter for other antenatal screening
  follow-up
  HIV affecting pregnancy, second trimester
  Low Risk NIPS
 ----------------------------------------------------------------------
Vital Signs

 BMI:
Fetal Evaluation

 Num Of Fetuses:          1
 Fetal Heart Rate(bpm):   154
 Cardiac Activity:        Observed
 Presentation:            Cephalic
 Placenta:                Posterior
 P. Cord Insertion:       Previously Visualized

 Amniotic Fluid
 AFI FV:      Within normal limits

                             Largest Pocket(cm)

Biometry

 BPD:        70  mm     G. Age:  28w 1d         76  %    CI:        70.68   %    70 - 86
                                                         FL/HC:       20.9  %    18.6 -
 HC:      265.4  mm     G. Age:  28w 6d         82  %    HC/AC:       1.13       1.05 -
 AC:      235.3  mm     G. Age:  27w 6d         68  %    FL/BPD:      79.1  %    71 - 87
 FL:       55.4  mm     G. Age:  29w 1d         91  %    FL/AC:       23.5  %    20 - 24

 Est. FW:    0664   gm   2 lb 11 oz      90  %
OB History

 Gravidity:    1         Term:   0        Prem:   0        SAB:   0
 TOP:          0       Ectopic:  0        Living: 0
Gestational Age

 LMP:           27w 0d        Date:  05/12/18                 EDD:   02/16/19
 U/S Today:     28w 4d                                        EDD:   02/05/19
 Best:          27w 0d     Det. By:  LMP  (05/12/18)          EDD:   02/16/19
Anatomy

 Cranium:               Appears normal         Aortic Arch:            Previously seen
 Cavum:                 Previously seen        Ductal Arch:            Previously seen
 Ventricles:            Previously seen        Diaphragm:              Previously seen
 Choroid Plexus:        Previously seen        Stomach:                Appears normal, left
                                                                       sided
 Cerebellum:            Previously seen        Abdomen:                Appears normal
 Posterior Fossa:       Previously seen        Abdominal Wall:         Previously seen
 Nuchal Fold:           Previously seen        Cord Vessels:           Previously seen
 Face:                  Orbits and profile     Kidneys:                Appear normal
                        previously seen
 Lips:                  Previously seen        Bladder:                Appears normal
 Thoracic:              Appears normal         Spine:                  Limited views
                                                                       appear normal
 Heart:                 Appears normal         Upper Extremities:      Previously seen
                        (4CH, axis, and
                        situs)
 RVOT:                  Appears normal         Lower Extremities:      Previously seen
 LVOT:                  Appears normal

 Other:  Technically difficult due to fetal position. Feet previously visualized.
Cervix Uterus Adnexa

 Cervix
 Not visualized (advanced GA >51wks)
Impression

 Normal interval growth.
 Spine views are limited again however, the intracranial
 anatomy is normal.
Recommendations

 Follow up growth in 4 weeks.

## 2021-04-18 ENCOUNTER — Ambulatory Visit: Payer: Medicaid Other | Admitting: Family

## 2021-05-07 ENCOUNTER — Encounter: Payer: Self-pay | Admitting: Family

## 2021-05-07 ENCOUNTER — Ambulatory Visit (INDEPENDENT_AMBULATORY_CARE_PROVIDER_SITE_OTHER): Payer: 59 | Admitting: Family

## 2021-05-07 ENCOUNTER — Other Ambulatory Visit (HOSPITAL_COMMUNITY)
Admission: RE | Admit: 2021-05-07 | Discharge: 2021-05-07 | Disposition: A | Discharge status: Home / Self Care | Payer: 59 | Source: Ambulatory Visit | Attending: Family | Admitting: Family

## 2021-05-07 ENCOUNTER — Other Ambulatory Visit: Payer: Self-pay

## 2021-05-07 ENCOUNTER — Other Ambulatory Visit (HOSPITAL_COMMUNITY): Payer: Self-pay

## 2021-05-07 VITALS — BP 131/80 | HR 90 | Temp 97.9°F | Ht 67.0 in | Wt 187.0 lb

## 2021-05-07 DIAGNOSIS — B20 Human immunodeficiency virus [HIV] disease: Secondary | ICD-10-CM | POA: Diagnosis not present

## 2021-05-07 DIAGNOSIS — Z0000 Encounter for general adult medical examination without abnormal findings: Secondary | ICD-10-CM

## 2021-05-07 DIAGNOSIS — Z113 Encounter for screening for infections with a predominantly sexual mode of transmission: Secondary | ICD-10-CM | POA: Diagnosis present

## 2021-05-07 MED ORDER — EMTRICITAB-RILPIVIR-TENOFOV AF 200-25-25 MG PO TABS
1.0000 | ORAL_TABLET | Freq: Every day | ORAL | 3 refills | Status: DC
  Filled 2021-05-07 – 2021-06-04 (×2): qty 30, 30d supply, fill #0
  Filled 2021-06-26: qty 30, 30d supply, fill #1
  Filled 2021-07-26: qty 30, 30d supply, fill #2

## 2021-05-07 NOTE — Assessment & Plan Note (Signed)
·   Discussed importance of safe sexual practices and condom use.  Condoms offered.  Declines vaccines.  Discussed/recommended routine dental care if she has Nurse, learning disability.  Can refer to [facility] if assistance needed.

## 2021-05-07 NOTE — Assessment & Plan Note (Signed)
Ms. Swigart appears to have improved adherence and good tolerance to her ART regimen of Odefsey.  No signs/symptoms of opportunistic infection.  Discussed importance of taking medication daily as prescribed.  Check lab work today.  Continue current dose of Odefsey.  Plan for follow-up in 2 months or sooner if needed with lab work on the same day.

## 2021-05-07 NOTE — Patient Instructions (Addendum)
Nice to see you.  We will check your lab work today.  Continue to take your medication daily as prescribed.  Refills have been sent to the pharmacy.  Plan for follow up in 2 month or sooner if needed with lab work on the same day.  Have a great day and stay safe!  
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
## 2021-05-08 LAB — T-HELPER CELL (CD4) - (RCID CLINIC ONLY)
CD4 % Helper T Cell: 26 % — ABNORMAL LOW (ref 33–65)
CD4 T Cell Abs: 406 /uL (ref 400–1790)

## 2021-05-08 LAB — URINE CYTOLOGY ANCILLARY ONLY
Chlamydia: NEGATIVE
Comment: NEGATIVE
Comment: NORMAL
Neisseria Gonorrhea: NEGATIVE

## 2021-05-09 LAB — COMPLETE METABOLIC PANEL WITH GFR
AG Ratio: 0.9 (calc) — ABNORMAL LOW (ref 1.0–2.5)
ALT: 15 U/L (ref 6–29)
AST: 14 U/L (ref 10–30)
Albumin: 4.4 g/dL (ref 3.6–5.1)
Alkaline phosphatase (APISO): 42 U/L (ref 31–125)
BUN: 11 mg/dL (ref 7–25)
CO2: 25 mmol/L (ref 20–32)
Calcium: 9.3 mg/dL (ref 8.6–10.2)
Chloride: 108 mmol/L (ref 98–110)
Creat: 0.78 mg/dL (ref 0.50–0.96)
Globulin: 4.8 g/dL (calc) — ABNORMAL HIGH (ref 1.9–3.7)
Glucose, Bld: 91 mg/dL (ref 65–99)
Potassium: 3.9 mmol/L (ref 3.5–5.3)
Sodium: 136 mmol/L (ref 135–146)
Total Bilirubin: 0.4 mg/dL (ref 0.2–1.2)
Total Protein: 9.2 g/dL — ABNORMAL HIGH (ref 6.1–8.1)
eGFR: 108 mL/min/{1.73_m2} (ref >=60)

## 2021-05-09 LAB — RPR TITER: RPR Titer: 1:1 {titer} — ABNORMAL HIGH

## 2021-05-09 LAB — FLUORESCENT TREPONEMAL AB(FTA)-IGG-BLD: Fluorescent Treponemal ABS: NONREACTIVE

## 2021-05-09 LAB — HIV-1 RNA QUANT-NO REFLEX-BLD
HIV 1 RNA Quant: NOT DETECTED Copies/mL
HIV-1 RNA Quant, Log: NOT DETECTED Log cps/mL

## 2021-05-09 LAB — RPR: RPR Ser Ql: REACTIVE — AB

## 2021-05-17 ENCOUNTER — Other Ambulatory Visit (HOSPITAL_COMMUNITY): Payer: Self-pay

## 2021-05-31 ENCOUNTER — Other Ambulatory Visit (HOSPITAL_COMMUNITY): Payer: Self-pay

## 2021-06-04 ENCOUNTER — Other Ambulatory Visit (HOSPITAL_COMMUNITY): Payer: Self-pay

## 2021-06-25 ENCOUNTER — Other Ambulatory Visit (HOSPITAL_COMMUNITY): Payer: Self-pay

## 2021-06-26 ENCOUNTER — Other Ambulatory Visit (HOSPITAL_COMMUNITY): Payer: Self-pay

## 2021-06-27 ENCOUNTER — Other Ambulatory Visit (HOSPITAL_COMMUNITY): Payer: Self-pay

## 2021-07-04 ENCOUNTER — Ambulatory Visit: Payer: 59 | Admitting: Family

## 2021-07-11 ENCOUNTER — Telehealth: Payer: Self-pay

## 2021-07-11 NOTE — Telephone Encounter (Signed)
Called patient and left a voicemail in regards to getting missed appointment rescheduled.  ?

## 2021-07-22 ENCOUNTER — Other Ambulatory Visit (HOSPITAL_COMMUNITY): Payer: Self-pay

## 2021-07-26 ENCOUNTER — Other Ambulatory Visit (HOSPITAL_COMMUNITY): Payer: Self-pay

## 2021-07-29 ENCOUNTER — Ambulatory Visit: Payer: 59 | Admitting: Family

## 2021-07-29 ENCOUNTER — Other Ambulatory Visit (HOSPITAL_COMMUNITY): Payer: Self-pay

## 2021-07-31 ENCOUNTER — Ambulatory Visit: Payer: 59 | Admitting: Family

## 2021-08-06 ENCOUNTER — Other Ambulatory Visit (HOSPITAL_COMMUNITY)
Admission: RE | Admit: 2021-08-06 | Discharge: 2021-08-06 | Disposition: A | Discharge status: Home / Self Care | Payer: Medicare Other | Source: Ambulatory Visit | Attending: Family | Admitting: Family

## 2021-08-06 ENCOUNTER — Encounter: Payer: Self-pay | Admitting: Family

## 2021-08-06 ENCOUNTER — Ambulatory Visit (INDEPENDENT_AMBULATORY_CARE_PROVIDER_SITE_OTHER): Payer: Medicare Other | Admitting: Family

## 2021-08-06 ENCOUNTER — Other Ambulatory Visit: Payer: Self-pay

## 2021-08-06 VITALS — BP 135/87 | HR 69 | Temp 98.4°F | Ht 67.0 in | Wt 190.0 lb

## 2021-08-06 DIAGNOSIS — Z113 Encounter for screening for infections with a predominantly sexual mode of transmission: Secondary | ICD-10-CM

## 2021-08-06 DIAGNOSIS — Z79899 Other long term (current) drug therapy: Secondary | ICD-10-CM

## 2021-08-06 DIAGNOSIS — Z0000 Encounter for general adult medical examination without abnormal findings: Secondary | ICD-10-CM | POA: Diagnosis not present

## 2021-08-06 DIAGNOSIS — B20 Human immunodeficiency virus [HIV] disease: Secondary | ICD-10-CM | POA: Diagnosis not present

## 2021-08-06 DIAGNOSIS — F332 Major depressive disorder, recurrent severe without psychotic features: Secondary | ICD-10-CM

## 2021-08-06 MED ORDER — EMTRICITAB-RILPIVIR-TENOFOV AF 200-25-25 MG PO TABS: 1.0000 | ORAL_TABLET | Freq: Every day | ORAL | 5 refills | Status: AC

## 2021-08-06 NOTE — Patient Instructions (Addendum)
Nice to see you. ? ?We will check your lab work today. ? ?Continue to take your medication daily as prescribed. ? ?Refills have been sent to the pharmacy. ? ?Set up an appointment with Rexene Alberts, NP for your Pap.  ? ?Plan for follow up in 4 months or sooner if needed with lab work on the same day. ? ?Have a great day and stay safe! ? ?

## 2021-08-06 NOTE — Assessment & Plan Note (Signed)
Anna Mason has no current signs of depression appears to be well controlled with fluoxetine. Has good adherence and tolerance of medication. Continue current dose of fluoxetine.  ?
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
## 2021-08-06 NOTE — Assessment & Plan Note (Signed)
Ms. Wadsworth has well controlled virus with good adherence and tolerance to [facility]. Reviewed previous lab work and discussed plan of care. Check lab work today. Continue current dose of Odefsey. Plan for follow up in 4 months or sooner if needed with lab work on the same day.  ?

## 2021-08-06 NOTE — Assessment & Plan Note (Signed)
?   Discussed importance of safe sexual practice and condom use. Condoms offered. ?? Due for dental care which she will schedule independently - can refer to [facility] if needed.  ?? Due for cervical cancer screening and will schedule with Rexene Alberts, NP.  ? ?

## 2021-08-07 LAB — URINE CYTOLOGY ANCILLARY ONLY
Chlamydia: NEGATIVE
Comment: NEGATIVE
Comment: NORMAL
Neisseria Gonorrhea: NEGATIVE

## 2021-08-08 LAB — COMPREHENSIVE METABOLIC PANEL
AG Ratio: 1.1 (calc) (ref 1.0–2.5)
ALT: 9 U/L (ref 6–29)
AST: 13 U/L (ref 10–30)
Albumin: 4.2 g/dL (ref 3.6–5.1)
Alkaline phosphatase (APISO): 43 U/L (ref 31–125)
BUN: 12 mg/dL (ref 7–25)
CO2: 25 mmol/L (ref 20–32)
Calcium: 9 mg/dL (ref 8.6–10.2)
Chloride: 107 mmol/L (ref 98–110)
Creat: 0.76 mg/dL (ref 0.50–0.96)
Globulin: 3.8 g/dL (calc) — ABNORMAL HIGH (ref 1.9–3.7)
Glucose, Bld: 92 mg/dL (ref 65–99)
Potassium: 3.8 mmol/L (ref 3.5–5.3)
Sodium: 136 mmol/L (ref 135–146)
Total Bilirubin: 0.4 mg/dL (ref 0.2–1.2)
Total Protein: 8 g/dL (ref 6.1–8.1)

## 2021-08-08 LAB — HIV-1 RNA QUANT-NO REFLEX-BLD
HIV 1 RNA Quant: NOT DETECTED copies/mL
HIV-1 RNA Quant, Log: NOT DETECTED Log copies/mL

## 2021-08-08 LAB — RPR: RPR Ser Ql: NONREACTIVE

## 2021-08-14 ENCOUNTER — Other Ambulatory Visit: Payer: Self-pay | Admitting: Family

## 2021-08-14 ENCOUNTER — Other Ambulatory Visit (HOSPITAL_COMMUNITY): Payer: Self-pay

## 2021-08-14 DIAGNOSIS — B20 Human immunodeficiency virus [HIV] disease: Secondary | ICD-10-CM

## 2021-08-15 ENCOUNTER — Ambulatory Visit: Payer: Medicare Other | Admitting: Infectious Diseases

## 2021-08-15 ENCOUNTER — Other Ambulatory Visit (HOSPITAL_COMMUNITY): Payer: Self-pay

## 2021-09-03 ENCOUNTER — Other Ambulatory Visit (HOSPITAL_COMMUNITY): Payer: Self-pay

## 2021-09-06 ENCOUNTER — Other Ambulatory Visit (HOSPITAL_COMMUNITY): Payer: Self-pay

## 2021-09-06 ENCOUNTER — Other Ambulatory Visit: Payer: Self-pay | Admitting: Family

## 2021-09-06 DIAGNOSIS — B20 Human immunodeficiency virus [HIV] disease: Secondary | ICD-10-CM

## 2021-09-06 MED ORDER — ODEFSEY 200-25-25 MG PO TABS
1.0000 | ORAL_TABLET | Freq: Every day | ORAL | 3 refills | Status: DC
  Filled 2021-09-06: qty 30, 30d supply, fill #0
  Filled 2021-09-30: qty 30, 30d supply, fill #1
  Filled 2021-10-24: qty 30, 30d supply, fill #2
  Filled 2021-11-13: qty 30, 30d supply, fill #3

## 2021-09-10 ENCOUNTER — Other Ambulatory Visit (HOSPITAL_COMMUNITY): Payer: Self-pay

## 2021-09-12 ENCOUNTER — Other Ambulatory Visit (HOSPITAL_COMMUNITY): Payer: Self-pay

## 2021-09-24 ENCOUNTER — Other Ambulatory Visit (HOSPITAL_COMMUNITY): Payer: Self-pay

## 2021-09-26 ENCOUNTER — Other Ambulatory Visit (HOSPITAL_COMMUNITY): Payer: Self-pay

## 2021-09-30 ENCOUNTER — Other Ambulatory Visit (HOSPITAL_COMMUNITY): Payer: Self-pay

## 2021-10-23 ENCOUNTER — Other Ambulatory Visit (HOSPITAL_COMMUNITY): Payer: Self-pay

## 2021-10-24 ENCOUNTER — Other Ambulatory Visit (HOSPITAL_COMMUNITY): Payer: Self-pay

## 2021-11-13 ENCOUNTER — Other Ambulatory Visit (HOSPITAL_COMMUNITY): Payer: Self-pay

## 2021-11-18 ENCOUNTER — Other Ambulatory Visit (HOSPITAL_COMMUNITY): Payer: Self-pay

## 2021-11-28 ENCOUNTER — Ambulatory Visit: Payer: Medicare Other | Admitting: Family

## 2021-12-06 ENCOUNTER — Other Ambulatory Visit (HOSPITAL_COMMUNITY): Payer: Self-pay

## 2021-12-09 ENCOUNTER — Other Ambulatory Visit (HOSPITAL_COMMUNITY): Payer: Self-pay

## 2021-12-11 ENCOUNTER — Other Ambulatory Visit (HOSPITAL_COMMUNITY): Payer: Self-pay

## 2021-12-13 ENCOUNTER — Other Ambulatory Visit (HOSPITAL_COMMUNITY): Payer: Self-pay

## 2021-12-24 ENCOUNTER — Other Ambulatory Visit: Payer: Self-pay

## 2021-12-24 ENCOUNTER — Telehealth: Payer: Self-pay

## 2021-12-24 DIAGNOSIS — B20 Human immunodeficiency virus [HIV] disease: Secondary | ICD-10-CM

## 2021-12-24 MED ORDER — ODEFSEY 200-25-25 MG PO TABS: 1.0000 | ORAL_TABLET | Freq: Every day | ORAL | 0 refills | Status: DC

## 2021-12-24 NOTE — Telephone Encounter (Signed)
Patient called requesting her medical records be faxed to [facility]. Patient is transferring care. Patient stated she is completely out of medication as well. Per Tammy Sours ok to send a 30 day supply of Odefsey in for the patient. Records release also faxed to medical records.  Sonda Coppens T Pricilla Loveless

## 2021-12-30 ENCOUNTER — Other Ambulatory Visit (HOSPITAL_COMMUNITY): Payer: Self-pay

## 2022-01-30 ENCOUNTER — Other Ambulatory Visit: Payer: Self-pay | Admitting: Family

## 2022-01-30 DIAGNOSIS — B20 Human immunodeficiency virus [HIV] disease: Secondary | ICD-10-CM

## 2022-01-31 NOTE — Telephone Encounter (Signed)
Notified [facility] of transfer of care.  Beryle Flock, RN
